# Patient Record
Sex: Female | Born: 1940 | Race: White | Hispanic: No | Marital: Married
Health system: Southern US, Community
[De-identification: ages and names within clinical notes are randomized; demographics above are authoritative.]

---

## 2000-08-16 ENCOUNTER — Encounter: Payer: Self-pay | Admitting: Neurosurgery

## 2000-08-16 ENCOUNTER — Ambulatory Visit (HOSPITAL_COMMUNITY): Admission: RE | Admit: 2000-08-16 | Discharge: 2000-08-16 | Payer: Self-pay | Admitting: Neurosurgery

## 2000-08-25 ENCOUNTER — Encounter: Admission: RE | Admit: 2000-08-25 | Discharge: 2000-11-23 | Payer: Self-pay | Admitting: Anesthesiology

## 2004-10-30 ENCOUNTER — Ambulatory Visit: Payer: Self-pay

## 2004-11-12 ENCOUNTER — Ambulatory Visit: Payer: Self-pay

## 2004-12-12 ENCOUNTER — Ambulatory Visit: Payer: Self-pay | Admitting: Gastroenterology

## 2006-01-23 ENCOUNTER — Other Ambulatory Visit: Payer: Self-pay

## 2006-01-24 ENCOUNTER — Inpatient Hospital Stay: Payer: Self-pay | Admitting: Internal Medicine

## 2006-09-16 ENCOUNTER — Other Ambulatory Visit: Payer: Self-pay

## 2006-09-16 ENCOUNTER — Inpatient Hospital Stay: Payer: Self-pay | Admitting: Internal Medicine

## 2007-11-02 ENCOUNTER — Ambulatory Visit: Payer: Self-pay

## 2008-11-13 ENCOUNTER — Ambulatory Visit: Payer: Self-pay

## 2009-11-14 ENCOUNTER — Ambulatory Visit: Payer: Self-pay

## 2010-12-04 ENCOUNTER — Ambulatory Visit: Payer: Self-pay

## 2010-12-30 ENCOUNTER — Ambulatory Visit: Payer: Self-pay

## 2011-03-07 ENCOUNTER — Inpatient Hospital Stay: Payer: Self-pay | Admitting: Internal Medicine

## 2011-05-18 ENCOUNTER — Ambulatory Visit: Payer: Self-pay | Admitting: Podiatry

## 2011-05-18 DIAGNOSIS — I499 Cardiac arrhythmia, unspecified: Secondary | ICD-10-CM

## 2011-05-22 ENCOUNTER — Ambulatory Visit: Payer: Self-pay | Admitting: Podiatry

## 2011-05-26 LAB — WOUND CULTURE

## 2011-05-26 LAB — PATHOLOGY REPORT

## 2011-06-09 ENCOUNTER — Ambulatory Visit: Payer: Self-pay

## 2011-06-25 ENCOUNTER — Ambulatory Visit: Payer: Self-pay | Admitting: Podiatry

## 2011-06-29 ENCOUNTER — Ambulatory Visit: Payer: Self-pay

## 2011-07-01 ENCOUNTER — Ambulatory Visit: Payer: Self-pay | Admitting: Podiatry

## 2011-07-01 LAB — CREATININE, SERUM
Creatinine: 0.57 mg/dL — ABNORMAL LOW (ref 0.60–1.30)
EGFR (African American): >60
EGFR (Non-African Amer.): >60

## 2011-07-10 ENCOUNTER — Ambulatory Visit: Payer: Self-pay | Admitting: Podiatry

## 2011-07-13 LAB — WOUND CULTURE

## 2011-07-14 LAB — WOUND CULTURE

## 2011-07-14 LAB — PATHOLOGY REPORT

## 2011-07-16 LAB — CULTURE, FUNGUS WITHOUT SMEAR

## 2011-08-04 ENCOUNTER — Other Ambulatory Visit: Payer: Self-pay | Admitting: Podiatry

## 2011-08-04 LAB — WOUND CULTURE

## 2011-08-06 ENCOUNTER — Ambulatory Visit: Payer: Self-pay

## 2011-08-08 LAB — WOUND CULTURE

## 2011-08-10 LAB — CULTURE, FUNGUS WITHOUT SMEAR

## 2011-08-13 LAB — CULTURE, FUNGUS WITHOUT SMEAR

## 2011-08-17 LAB — CULTURE, FUNGUS WITHOUT SMEAR

## 2011-09-24 ENCOUNTER — Emergency Department: Payer: Self-pay | Admitting: Emergency Medicine

## 2011-09-24 LAB — URINALYSIS, COMPLETE
Bilirubin,UR: NEGATIVE
Glucose,UR: >=500 mg/dL (ref 0–75)
Ph: 6 (ref 4.5–8.0)
Protein: 30
RBC,UR: 1 /HPF (ref 0–5)
Squamous Epithelial: 4
WBC UR: 17 /HPF (ref 0–5)

## 2011-09-24 LAB — COMPREHENSIVE METABOLIC PANEL
Albumin: 3.8 g/dL (ref 3.4–5.0)
Anion Gap: 8 (ref 7–16)
Bilirubin,Total: 0.4 mg/dL (ref 0.2–1.0)
Calcium, Total: 9.5 mg/dL (ref 8.5–10.1)
Chloride: 95 mmol/L — ABNORMAL LOW (ref 98–107)
EGFR (African American): >60
Glucose: 296 mg/dL — ABNORMAL HIGH (ref 65–99)
Osmolality: 279 (ref 275–301)
Potassium: 3.8 mmol/L (ref 3.5–5.1)
SGOT(AST): 19 U/L (ref 15–37)
SGPT (ALT): 23 U/L
Total Protein: 8.2 g/dL (ref 6.4–8.2)

## 2011-09-24 LAB — CBC
HCT: 41.8 % (ref 35.0–47.0)
MCH: 28.1 pg (ref 26.0–34.0)
MCV: 87 fL (ref 80–100)
Platelet: 234 10*3/uL (ref 150–440)
WBC: 11.4 10*3/uL — ABNORMAL HIGH (ref 3.6–11.0)

## 2011-10-07 ENCOUNTER — Emergency Department: Payer: Self-pay

## 2011-10-07 LAB — URINALYSIS, COMPLETE
Bilirubin,UR: NEGATIVE
Blood: NEGATIVE
Glucose,UR: 150 mg/dL (ref 0–75)
Hyaline Cast: 1
Leukocyte Esterase: NEGATIVE
Nitrite: NEGATIVE
Ph: 6 (ref 4.5–8.0)
Protein: 30
RBC,UR: 1 /HPF (ref 0–5)
Specific Gravity: 1.008 (ref 1.003–1.030)
Squamous Epithelial: 2
WBC UR: 15 /HPF (ref 0–5)

## 2011-10-07 LAB — COMPREHENSIVE METABOLIC PANEL
Albumin: 4.3 g/dL (ref 3.4–5.0)
Anion Gap: 13 (ref 7–16)
BUN: 12 mg/dL (ref 7–18)
Bilirubin,Total: 0.5 mg/dL (ref 0.2–1.0)
Calcium, Total: 9.6 mg/dL (ref 8.5–10.1)
Creatinine: 0.53 mg/dL — ABNORMAL LOW (ref 0.60–1.30)
EGFR (African American): >60
Glucose: 297 mg/dL — ABNORMAL HIGH (ref 65–99)
Osmolality: 275 (ref 275–301)
SGOT(AST): 21 U/L (ref 15–37)
SGPT (ALT): 19 U/L
Sodium: 132 mmol/L — ABNORMAL LOW (ref 136–145)

## 2011-10-07 LAB — CBC
HCT: 44.3 % (ref 35.0–47.0)
HGB: 15.2 g/dL (ref 12.0–16.0)
MCH: 29.1 pg (ref 26.0–34.0)
MCHC: 34.3 g/dL (ref 32.0–36.0)
MCV: 85 fL (ref 80–100)
RBC: 5.22 10*6/uL — ABNORMAL HIGH (ref 3.80–5.20)
WBC: 9.3 10*3/uL (ref 3.6–11.0)

## 2011-10-07 LAB — TSH: Thyroid Stimulating Horm: 0.04 u[IU]/mL — ABNORMAL LOW

## 2011-10-07 LAB — LIPASE, BLOOD: Lipase: 47 U/L — ABNORMAL LOW (ref 73–393)

## 2011-10-12 LAB — URINE CULTURE

## 2011-11-20 ENCOUNTER — Ambulatory Visit: Payer: Self-pay | Admitting: Internal Medicine

## 2011-11-20 LAB — CREATININE, SERUM
Creatinine: 1.1 mg/dL (ref 0.60–1.30)
EGFR (African American): 60 — ABNORMAL LOW
EGFR (Non-African Amer.): 52 — ABNORMAL LOW

## 2011-12-03 ENCOUNTER — Encounter: Payer: Self-pay | Admitting: Otolaryngology

## 2011-12-17 ENCOUNTER — Encounter: Payer: Self-pay | Admitting: Otolaryngology

## 2011-12-28 ENCOUNTER — Emergency Department: Payer: Self-pay | Admitting: Emergency Medicine

## 2011-12-28 LAB — URINALYSIS, COMPLETE
Bilirubin,UR: NEGATIVE
Blood: NEGATIVE
Glucose,UR: 50 mg/dL (ref 0–75)
Leukocyte Esterase: NEGATIVE
Nitrite: POSITIVE
Ph: 5 (ref 4.5–8.0)
Protein: NEGATIVE
RBC,UR: <1 /HPF (ref 0–5)
Specific Gravity: 1.044 (ref 1.003–1.030)
Squamous Epithelial: <1
WBC UR: 2 /HPF (ref 0–5)

## 2011-12-28 LAB — COMPREHENSIVE METABOLIC PANEL
Albumin: 4.1 g/dL (ref 3.4–5.0)
Alkaline Phosphatase: 96 U/L (ref 50–136)
Anion Gap: 12 (ref 7–16)
BUN: 16 mg/dL (ref 7–18)
Bilirubin,Total: 0.6 mg/dL (ref 0.2–1.0)
Calcium, Total: 9.6 mg/dL (ref 8.5–10.1)
Chloride: 99 mmol/L (ref 98–107)
Co2: 25 mmol/L (ref 21–32)
Creatinine: 0.95 mg/dL (ref 0.60–1.30)
EGFR (African American): >60
EGFR (Non-African Amer.): >60
Glucose: 192 mg/dL — ABNORMAL HIGH (ref 65–99)
Osmolality: 278 (ref 275–301)
Potassium: 3.3 mmol/L — ABNORMAL LOW (ref 3.5–5.1)
SGOT(AST): 15 U/L (ref 15–37)
SGPT (ALT): 16 U/L (ref 12–78)
Sodium: 136 mmol/L (ref 136–145)
Total Protein: 8.5 g/dL — ABNORMAL HIGH (ref 6.4–8.2)

## 2011-12-28 LAB — CBC
HCT: 39.6 % (ref 35.0–47.0)
HGB: 13.5 g/dL (ref 12.0–16.0)
MCH: 30 pg (ref 26.0–34.0)
MCHC: 34.2 g/dL (ref 32.0–36.0)
MCV: 88 fL (ref 80–100)
Platelet: 342 10*3/uL (ref 150–440)
RBC: 4.51 10*6/uL (ref 3.80–5.20)
RDW: 14.2 % (ref 11.5–14.5)
WBC: 8.3 10*3/uL (ref 3.6–11.0)

## 2011-12-28 LAB — TROPONIN I: Troponin-I: <0.02 ng/mL

## 2011-12-28 LAB — LIPASE, BLOOD: Lipase: 46 U/L — ABNORMAL LOW (ref 73–393)

## 2012-01-13 ENCOUNTER — Ambulatory Visit: Payer: Self-pay | Admitting: Internal Medicine

## 2012-01-17 ENCOUNTER — Encounter: Payer: Self-pay | Admitting: Otolaryngology

## 2012-01-26 ENCOUNTER — Ambulatory Visit: Payer: Self-pay | Admitting: Internal Medicine

## 2012-02-01 ENCOUNTER — Inpatient Hospital Stay: Payer: Self-pay | Admitting: Internal Medicine

## 2012-02-01 LAB — URINALYSIS, COMPLETE
Bilirubin,UR: NEGATIVE
Hyaline Cast: 6
Ph: 5 (ref 4.5–8.0)
Protein: 100
RBC,UR: 2 /HPF (ref 0–5)
Specific Gravity: 1.02 (ref 1.003–1.030)
Squamous Epithelial: NONE SEEN

## 2012-02-01 LAB — CBC WITH DIFFERENTIAL/PLATELET
Basophil %: 0.2 %
Eosinophil %: 0.1 %
Lymphocyte %: 5.2 %
MCH: 30.9 pg (ref 26.0–34.0)
MCV: 91 fL (ref 80–100)
Monocyte #: 1.6 x10 3/mm — ABNORMAL HIGH (ref 0.2–0.9)
Platelet: 306 10*3/uL (ref 150–440)
RBC: 4.12 10*6/uL (ref 3.80–5.20)
RDW: 13.4 % (ref 11.5–14.5)
WBC: 21.9 10*3/uL — ABNORMAL HIGH (ref 3.6–11.0)

## 2012-02-01 LAB — COMPREHENSIVE METABOLIC PANEL
Albumin: 3.2 g/dL — ABNORMAL LOW (ref 3.4–5.0)
Alkaline Phosphatase: 89 U/L (ref 50–136)
Anion Gap: 11 (ref 7–16)
Calcium, Total: 8.8 mg/dL (ref 8.5–10.1)
Chloride: 99 mmol/L (ref 98–107)
Co2: 26 mmol/L (ref 21–32)
EGFR (Non-African Amer.): 55 — ABNORMAL LOW
Glucose: 174 mg/dL — ABNORMAL HIGH (ref 65–99)
SGOT(AST): 9 U/L — ABNORMAL LOW (ref 15–37)
SGPT (ALT): 12 U/L (ref 12–78)
Sodium: 136 mmol/L (ref 136–145)

## 2012-02-02 LAB — CBC WITH DIFFERENTIAL/PLATELET
Basophil #: 0 10*3/uL (ref 0.0–0.1)
Basophil %: 0.1 %
Eosinophil %: 0 %
HCT: 33.3 % — ABNORMAL LOW (ref 35.0–47.0)
HGB: 11.4 g/dL — ABNORMAL LOW (ref 12.0–16.0)
Lymphocyte #: 0.8 10*3/uL — ABNORMAL LOW (ref 1.0–3.6)
MCH: 31 pg (ref 26.0–34.0)
MCV: 91 fL (ref 80–100)
Monocyte #: 0.9 x10 3/mm (ref 0.2–0.9)
Monocyte %: 6.1 %
Neutrophil %: 88.6 %
Platelet: 228 10*3/uL (ref 150–440)
RBC: 3.68 10*6/uL — ABNORMAL LOW (ref 3.80–5.20)
WBC: 15 10*3/uL — ABNORMAL HIGH (ref 3.6–11.0)

## 2012-02-02 LAB — COMPREHENSIVE METABOLIC PANEL
Albumin: 2.8 g/dL — ABNORMAL LOW (ref 3.4–5.0)
Anion Gap: 7 (ref 7–16)
BUN: 16 mg/dL (ref 7–18)
Bilirubin,Total: 0.8 mg/dL (ref 0.2–1.0)
Co2: 27 mmol/L (ref 21–32)
Creatinine: 0.5 mg/dL — ABNORMAL LOW (ref 0.60–1.30)
EGFR (Non-African Amer.): >60
Glucose: 271 mg/dL — ABNORMAL HIGH (ref 65–99)
Osmolality: 279 (ref 275–301)
Potassium: 3.4 mmol/L — ABNORMAL LOW (ref 3.5–5.1)
SGOT(AST): 3 U/L — ABNORMAL LOW (ref 15–37)
Sodium: 134 mmol/L — ABNORMAL LOW (ref 136–145)
Total Protein: 7 g/dL (ref 6.4–8.2)

## 2012-02-03 LAB — CBC WITH DIFFERENTIAL/PLATELET
Basophil %: 0.2 %
Eosinophil #: 0 10*3/uL (ref 0.0–0.7)
Eosinophil %: 0.1 %
HGB: 9.8 g/dL — ABNORMAL LOW (ref 12.0–16.0)
Lymphocyte #: 1.1 10*3/uL (ref 1.0–3.6)
Lymphocyte %: 8.3 %
MCH: 31.3 pg (ref 26.0–34.0)
MCHC: 34.1 g/dL (ref 32.0–36.0)
MCV: 92 fL (ref 80–100)
Monocyte #: 0.9 x10 3/mm (ref 0.2–0.9)
Monocyte %: 7 %
Neutrophil #: 11 10*3/uL — ABNORMAL HIGH (ref 1.4–6.5)
Neutrophil %: 84.4 %
Platelet: 202 10*3/uL (ref 150–440)

## 2012-02-03 LAB — BASIC METABOLIC PANEL
BUN: 11 mg/dL (ref 7–18)
Calcium, Total: 8.4 mg/dL — ABNORMAL LOW (ref 8.5–10.1)
Chloride: 103 mmol/L (ref 98–107)
Co2: 24 mmol/L (ref 21–32)
Osmolality: 280 (ref 275–301)
Potassium: 4.1 mmol/L (ref 3.5–5.1)

## 2012-02-04 LAB — CBC WITH DIFFERENTIAL/PLATELET
Basophil #: 0 10*3/uL (ref 0.0–0.1)
Eosinophil #: 0 10*3/uL (ref 0.0–0.7)
Eosinophil %: 0.3 %
HCT: 30.8 % — ABNORMAL LOW (ref 35.0–47.0)
Lymphocyte #: 1 10*3/uL (ref 1.0–3.6)
Lymphocyte %: 8.7 %
MCH: 30.6 pg (ref 26.0–34.0)
MCV: 92 fL (ref 80–100)
Monocyte %: 7.7 %
Platelet: 246 10*3/uL (ref 150–440)
RBC: 3.35 10*6/uL — ABNORMAL LOW (ref 3.80–5.20)
RDW: 13.1 % (ref 11.5–14.5)
WBC: 11.5 10*3/uL — ABNORMAL HIGH (ref 3.6–11.0)

## 2012-02-04 LAB — DIGOXIN LEVEL: Digoxin: 0.55 ng/mL

## 2012-02-05 LAB — BASIC METABOLIC PANEL
Anion Gap: 8 (ref 7–16)
BUN: 9 mg/dL (ref 7–18)
Calcium, Total: 8.8 mg/dL (ref 8.5–10.1)
Chloride: 97 mmol/L — ABNORMAL LOW (ref 98–107)
EGFR (African American): >60
Glucose: 100 mg/dL — ABNORMAL HIGH (ref 65–99)
Osmolality: 271 (ref 275–301)

## 2012-02-05 LAB — CBC WITH DIFFERENTIAL/PLATELET
Basophil #: 0 10*3/uL (ref 0.0–0.1)
Basophil %: 0.4 %
Eosinophil %: 1 %
HCT: 32.6 % — ABNORMAL LOW (ref 35.0–47.0)
HGB: 11.3 g/dL — ABNORMAL LOW (ref 12.0–16.0)
Lymphocyte %: 17.1 %
MCH: 31.5 pg (ref 26.0–34.0)
MCHC: 34.8 g/dL (ref 32.0–36.0)
Monocyte #: 0.8 x10 3/mm (ref 0.2–0.9)
Monocyte %: 10.4 %
Neutrophil %: 71.1 %
RBC: 3.6 10*6/uL — ABNORMAL LOW (ref 3.80–5.20)
WBC: 7.9 10*3/uL (ref 3.6–11.0)

## 2012-02-05 LAB — AMYLASE: Amylase: 11 U/L — ABNORMAL LOW (ref 25–115)

## 2012-02-06 LAB — COMPREHENSIVE METABOLIC PANEL
Albumin: 2.3 g/dL — ABNORMAL LOW (ref 3.4–5.0)
Alkaline Phosphatase: 83 U/L (ref 50–136)
Anion Gap: 6 — ABNORMAL LOW (ref 7–16)
BUN: 11 mg/dL (ref 7–18)
Calcium, Total: 9.1 mg/dL (ref 8.5–10.1)
Co2: 34 mmol/L — ABNORMAL HIGH (ref 21–32)
EGFR (African American): >60
EGFR (Non-African Amer.): >60
Potassium: 3.4 mmol/L — ABNORMAL LOW (ref 3.5–5.1)
SGPT (ALT): 11 U/L — ABNORMAL LOW (ref 12–78)
Sodium: 136 mmol/L (ref 136–145)
Total Protein: 6.7 g/dL (ref 6.4–8.2)

## 2012-02-06 LAB — CBC WITH DIFFERENTIAL/PLATELET
Basophil #: 0 10*3/uL (ref 0.0–0.1)
Basophil %: 0.6 %
Eosinophil #: 0.1 10*3/uL (ref 0.0–0.7)
HCT: 31.9 % — ABNORMAL LOW (ref 35.0–47.0)
HGB: 11.1 g/dL — ABNORMAL LOW (ref 12.0–16.0)
Lymphocyte #: 1.8 10*3/uL (ref 1.0–3.6)
Lymphocyte %: 30.3 %
MCHC: 34.7 g/dL (ref 32.0–36.0)
MCV: 90 fL (ref 80–100)
Monocyte %: 13.7 %
Neutrophil #: 3.2 10*3/uL (ref 1.4–6.5)
Platelet: 379 10*3/uL (ref 150–440)
RBC: 3.55 10*6/uL — ABNORMAL LOW (ref 3.80–5.20)
RDW: 13.1 % (ref 11.5–14.5)

## 2012-02-06 LAB — URINE CULTURE

## 2012-02-07 LAB — BASIC METABOLIC PANEL
BUN: 12 mg/dL (ref 7–18)
Calcium, Total: 9.3 mg/dL (ref 8.5–10.1)
Chloride: 98 mmol/L (ref 98–107)
Co2: 33 mmol/L — ABNORMAL HIGH (ref 21–32)
EGFR (Non-African Amer.): >60
Osmolality: 284 (ref 275–301)
Potassium: 4.2 mmol/L (ref 3.5–5.1)
Sodium: 139 mmol/L (ref 136–145)

## 2012-02-07 LAB — CBC WITH DIFFERENTIAL/PLATELET
Basophil #: 0 10*3/uL (ref 0.0–0.1)
Basophil %: 0.7 %
Eosinophil #: 0.1 10*3/uL (ref 0.0–0.7)
Eosinophil %: 1.9 %
HCT: 31.2 % — ABNORMAL LOW (ref 35.0–47.0)
HGB: 10.7 g/dL — ABNORMAL LOW (ref 12.0–16.0)
Lymphocyte %: 29.1 %
MCH: 30.9 pg (ref 26.0–34.0)
MCV: 90 fL (ref 80–100)
Monocyte #: 0.7 x10 3/mm (ref 0.2–0.9)
Monocyte %: 10.3 %
Neutrophil #: 3.8 10*3/uL (ref 1.4–6.5)
RBC: 3.45 10*6/uL — ABNORMAL LOW (ref 3.80–5.20)
RDW: 13.4 % (ref 11.5–14.5)

## 2012-02-07 LAB — CULTURE, BLOOD (SINGLE)

## 2012-02-08 LAB — CBC WITH DIFFERENTIAL/PLATELET
Basophil #: 0.1 10*3/uL (ref 0.0–0.1)
Basophil %: 1 %
Eosinophil #: 0.1 10*3/uL (ref 0.0–0.7)
HCT: 32.5 % — ABNORMAL LOW (ref 35.0–47.0)
HGB: 11.4 g/dL — ABNORMAL LOW (ref 12.0–16.0)
Lymphocyte %: 31.7 %
MCHC: 35.1 g/dL (ref 32.0–36.0)
Monocyte %: 10.6 %
Neutrophil %: 54.9 %
RBC: 3.59 10*6/uL — ABNORMAL LOW (ref 3.80–5.20)
RDW: 13.2 % (ref 11.5–14.5)
WBC: 7.3 10*3/uL (ref 3.6–11.0)

## 2012-02-08 LAB — BASIC METABOLIC PANEL
Calcium, Total: 9 mg/dL (ref 8.5–10.1)
Chloride: 99 mmol/L (ref 98–107)
Co2: 35 mmol/L — ABNORMAL HIGH (ref 21–32)
Osmolality: 277 (ref 275–301)
Potassium: 4.5 mmol/L (ref 3.5–5.1)

## 2012-02-09 LAB — URINALYSIS, COMPLETE
Bilirubin,UR: NEGATIVE
Glucose,UR: NEGATIVE mg/dL (ref 0–75)
Ph: 7 (ref 4.5–8.0)
Protein: NEGATIVE
RBC,UR: 8 /HPF (ref 0–5)
Renal Epithelial: 9
Specific Gravity: 1.013 (ref 1.003–1.030)
WBC UR: 6 /HPF (ref 0–5)

## 2012-02-09 LAB — CBC WITH DIFFERENTIAL/PLATELET
Basophil #: 0.1 10*3/uL (ref 0.0–0.1)
Eosinophil #: 0.1 10*3/uL (ref 0.0–0.7)
HCT: 30 % — ABNORMAL LOW (ref 35.0–47.0)
Lymphocyte #: 3.1 10*3/uL (ref 1.0–3.6)
MCH: 31.6 pg (ref 26.0–34.0)
MCHC: 34.8 g/dL (ref 32.0–36.0)
MCV: 91 fL (ref 80–100)
Monocyte #: 0.8 x10 3/mm (ref 0.2–0.9)
Monocyte %: 9.3 %
Neutrophil #: 4.6 10*3/uL (ref 1.4–6.5)
Neutrophil %: 53.1 %
RDW: 13.4 % (ref 11.5–14.5)

## 2012-02-09 LAB — BASIC METABOLIC PANEL
BUN: 10 mg/dL (ref 7–18)
Creatinine: 0.62 mg/dL (ref 0.60–1.30)
EGFR (Non-African Amer.): >60
Glucose: 196 mg/dL — ABNORMAL HIGH (ref 65–99)
Osmolality: 276 (ref 275–301)
Potassium: 4.1 mmol/L (ref 3.5–5.1)
Sodium: 136 mmol/L (ref 136–145)

## 2012-02-16 ENCOUNTER — Encounter: Payer: Self-pay | Admitting: Otolaryngology

## 2012-02-22 ENCOUNTER — Ambulatory Visit: Payer: Self-pay | Admitting: Internal Medicine

## 2012-03-07 ENCOUNTER — Ambulatory Visit: Payer: Self-pay | Admitting: Internal Medicine

## 2012-03-29 ENCOUNTER — Ambulatory Visit: Payer: Self-pay | Admitting: Internal Medicine

## 2012-06-13 ENCOUNTER — Other Ambulatory Visit: Payer: Self-pay | Admitting: Internal Medicine

## 2012-06-13 LAB — RAPID INFLUENZA A&B ANTIGENS

## 2012-10-28 ENCOUNTER — Ambulatory Visit: Payer: Self-pay | Admitting: Orthopedic Surgery

## 2012-12-14 ENCOUNTER — Ambulatory Visit: Payer: Self-pay | Admitting: Orthopedic Surgery

## 2013-08-24 IMAGING — CT CT ABD-PELV W/O CM
1 of 2 series · 15 of 32 positions shown, 19 images · non-contrast
Comparison: none

REASON FOR EXAM: (1) LUQ more swollen and tender; (2) increase temp
COMMENTS:

[Series 2: 3mm soft tissue · axial · 0.76mm/px · z∈[-676,-232]mm · 15 of 162 slices shown, 19 images]
[im 7/162  soft-tissue]
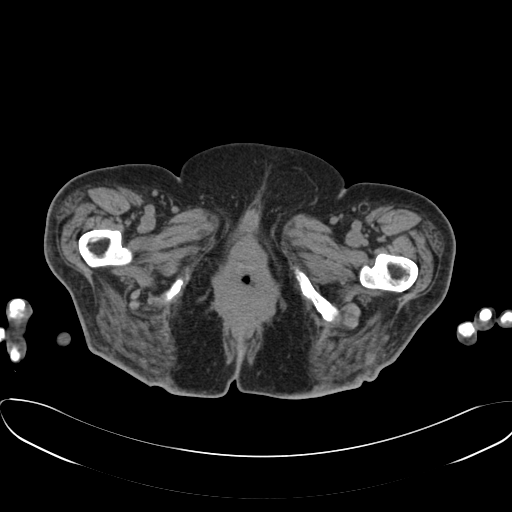
[im 7/162  bone]
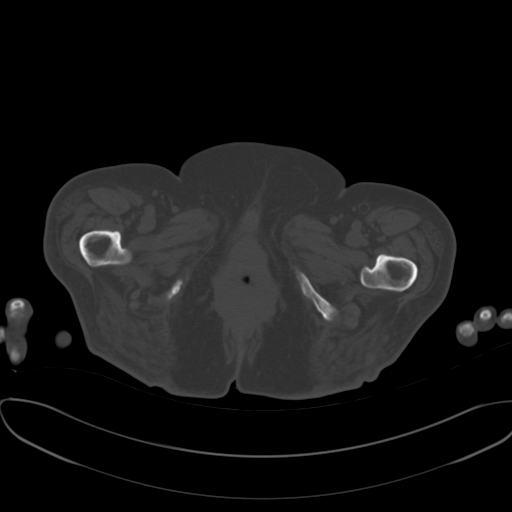
[im 20/162  soft-tissue]
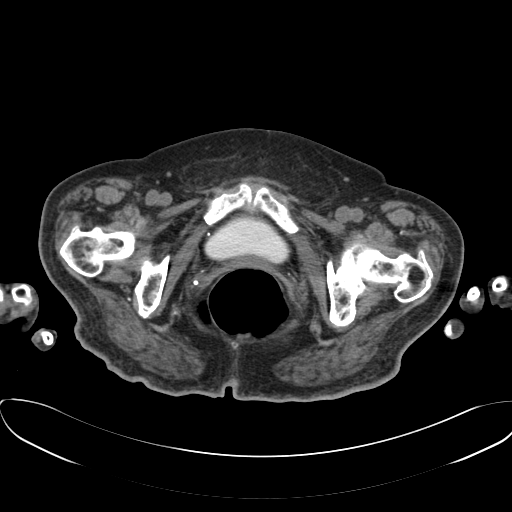
[im 33/162  soft-tissue]
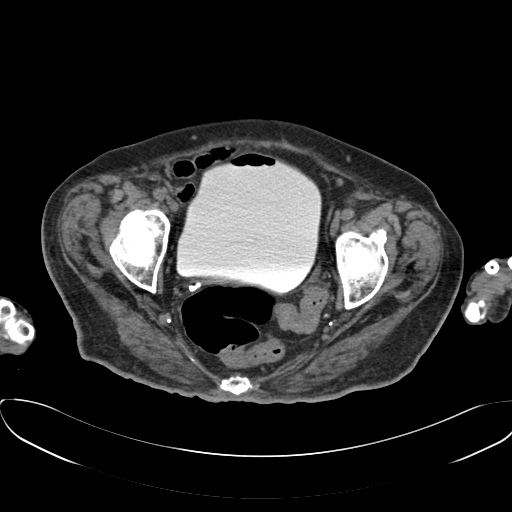
[im 46/162  soft-tissue]
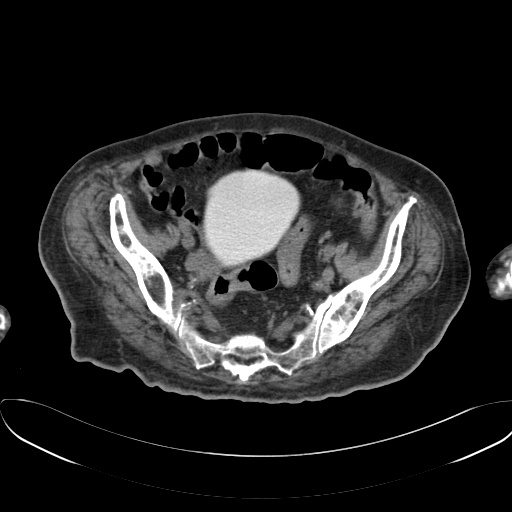
[im 58/162  soft-tissue]
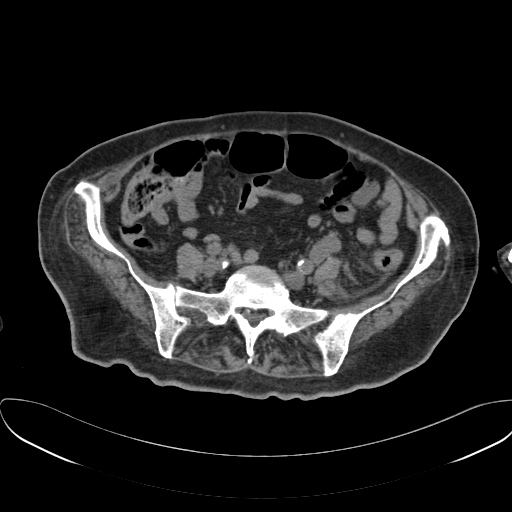
[im 71/162  soft-tissue]
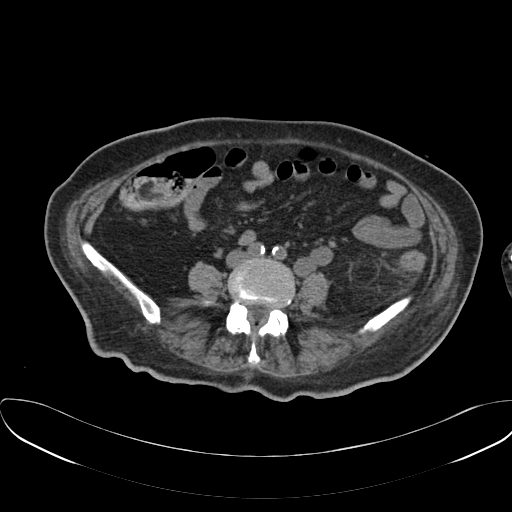
[im 84/162  soft-tissue]
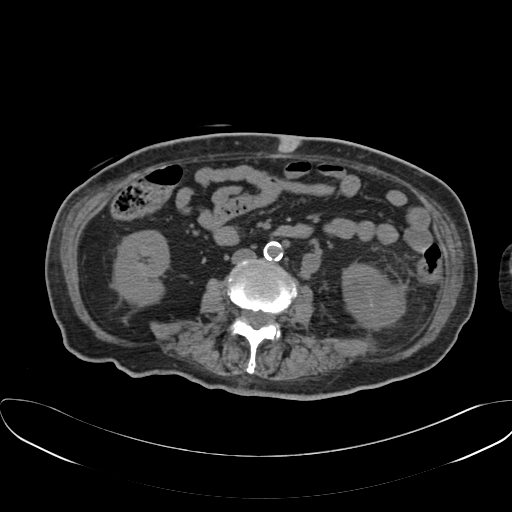
[im 91/162  soft-tissue]
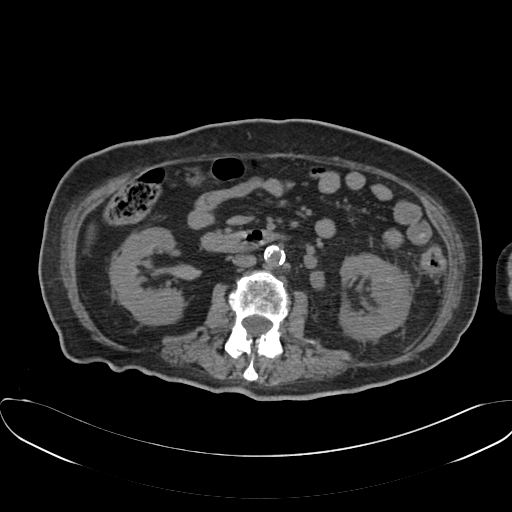
[im 104/162  soft-tissue]
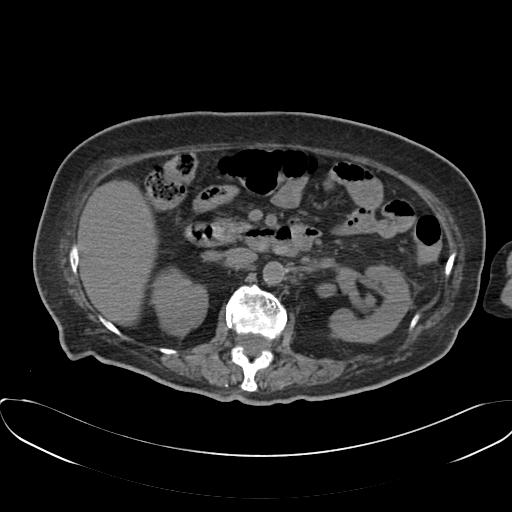
[im 104/162  bone]
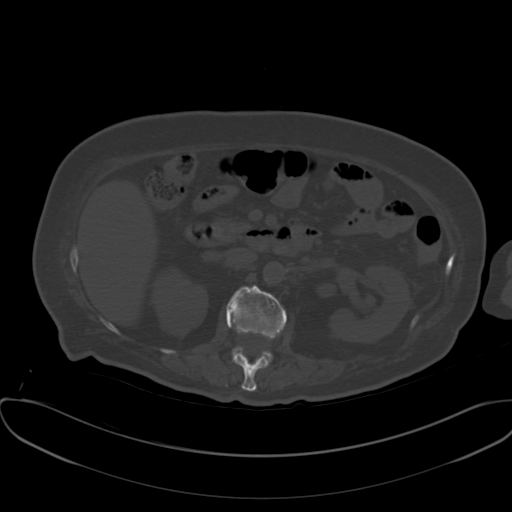
[im 116/162  soft-tissue]
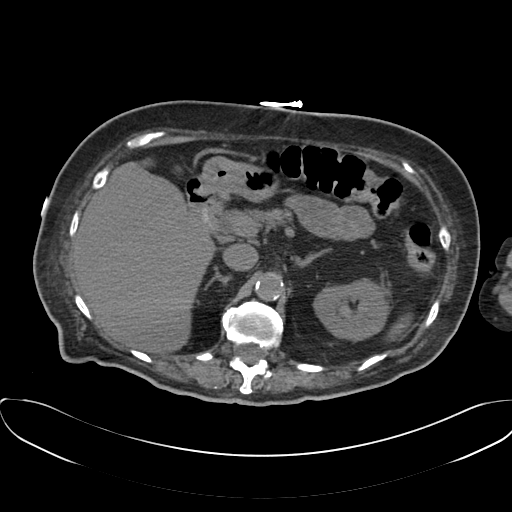
[im 129/162  soft-tissue]
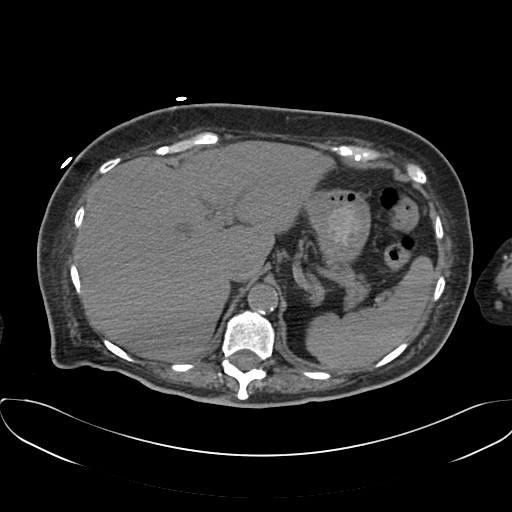
[im 136/162  lung]
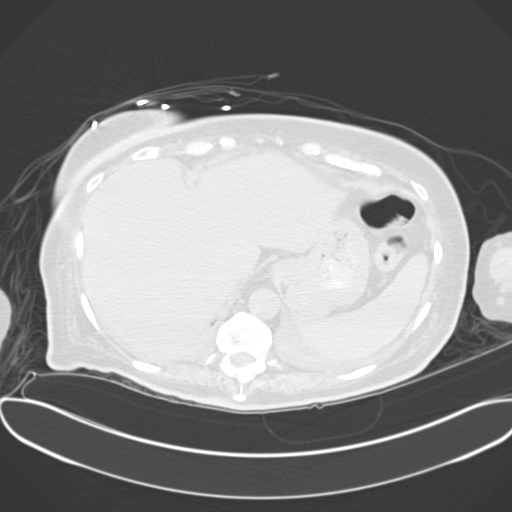
[im 142/162  soft-tissue]
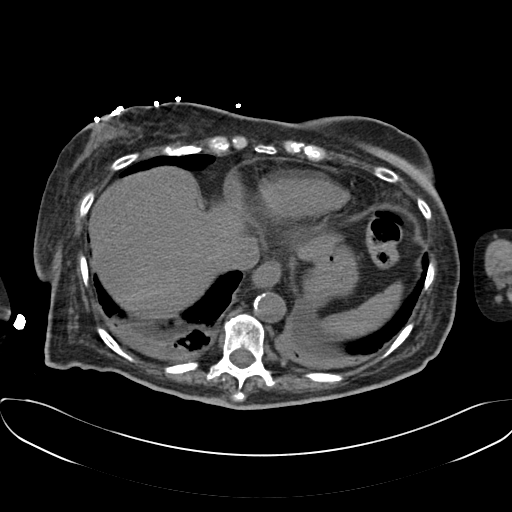
[im 142/162  lung]
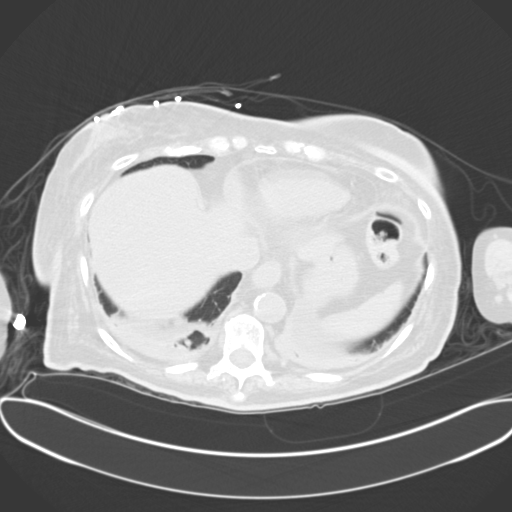
[im 149/162  lung]
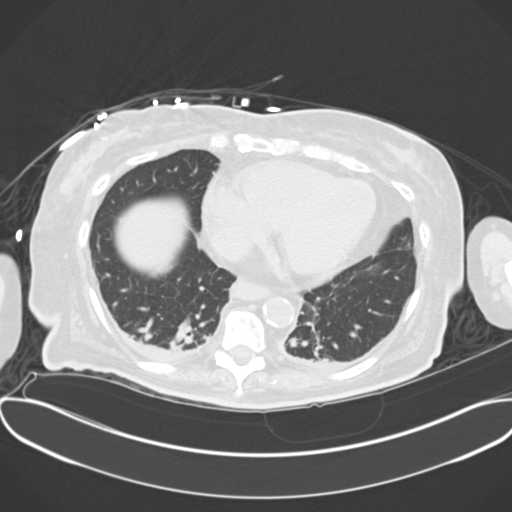
[im 155/162  soft-tissue]
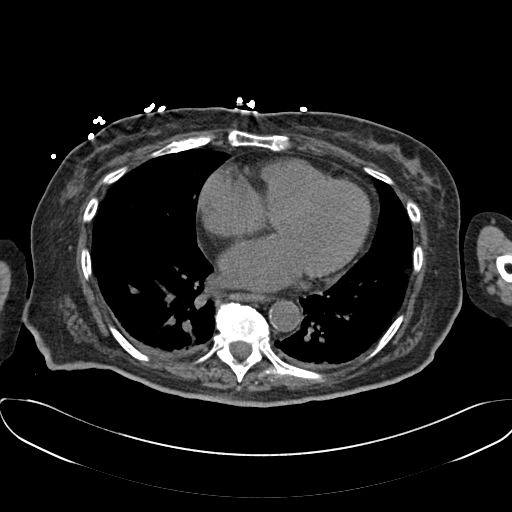
[im 155/162  lung]
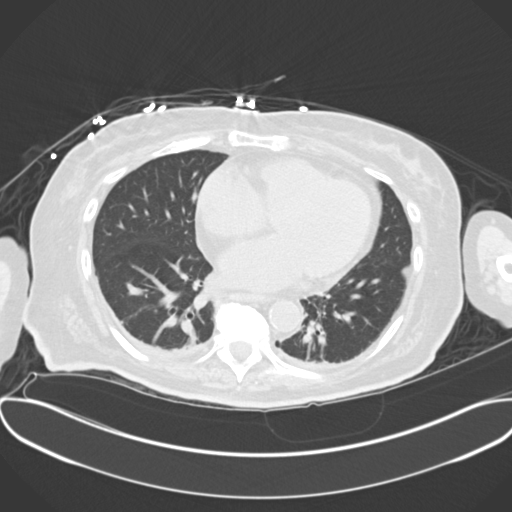

[15 of 32 positions shown; findings below may reference images not displayed]

PROCEDURE:     CT  - CT ABDOMEN AND PELVIS W[DATE]  [DATE]

RESULT:     CT of the abdomen and pelvis without additional contrast is
compared to the earlier study from [DATE] p.m. on 01 February, 2012 which was
performed with contrast. That study showed an abnormal area in the left
kidney with perinephric stranding. The area of abnormality in the left
kidney is less well demonstrated on today's exam given the lack of acute
contrast. There is perinephric stranding in the mid and lower left kidney
region with some thickening of the perirenal fascia. The appearance is
consistent with pyelonephritis. There is no hydronephrosis evident. The left
ureter is somewhat prominent but unchanged. There is urinary duplication on
the left with prominence of the upper pole and lower pole moieties. The
possibility mentioned previously of a rupture of the left renal cysts is not
completely excluded. The descending colon adjacent to the area of stranding
is slightly thickened which could be secondary. No diverticula are seen in
the region. There does not appear to be significant colonic diverticular
disease. The urinary bladder is filled with contrast opacified urine area
the abdominal wall appears intact. Scattered atherosclerotic calcification
is present. The right kidney is unremarkable. The other abdominal viscera
appear to be within normal limits area there is bilateral atelectasis in the
dependent portions of the lower lobes with trace bilateral pleural effusions.
IMPRESSION: No significant interval change compared to the previous
study. Findings may represent the sequela of pyelonephritis. Possibly some
reactive thickening in the ascending colon adjacent to the area of present
inflammation in the perinephric region on the left. Duplication of the
collecting system and ureters on the left with mild prominence. This is
unchanged. Trace pleural effusions and some dependent atelectasis.

[REDACTED]

## 2013-08-27 IMAGING — RF DG BARIUM SWALLOW
2 series · 8 of 8 positions shown · non-contrast
Comparison: none

REASON FOR EXAM: recurrent regurgitation
COMMENTS:

PROCEDURE:     FL  - FL BARIUM SWALLOW  - February 05, 2012  [DATE]
RESULT:     Comparison: None.
TECHNIQUE: Standard double contrast swallow was performed with monitoring by
intermittent fluoroscopy.

[Series 1: fluoro_barium 2fps_bw · 0.17mm/px · 4 of 23 frames shown (1 of 2)]
[frame 4/23]
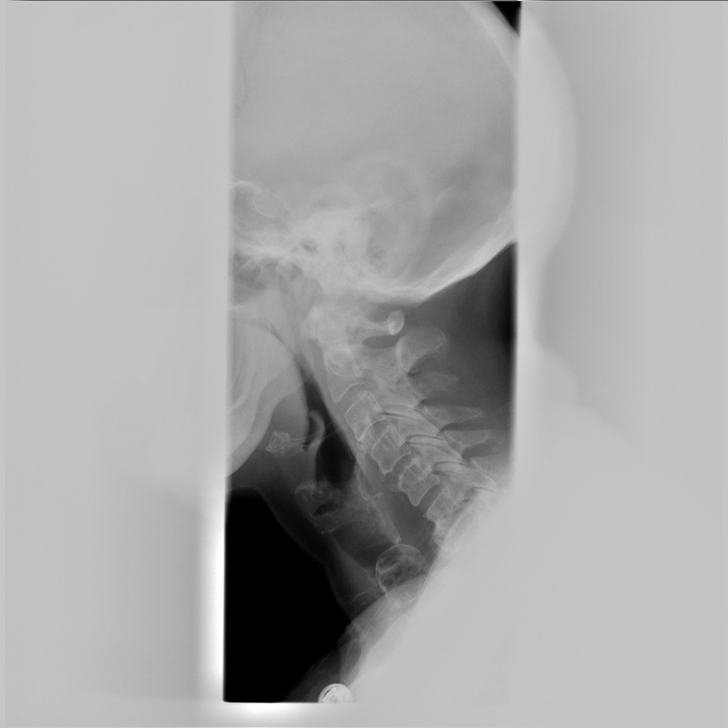
[frame 8/23]
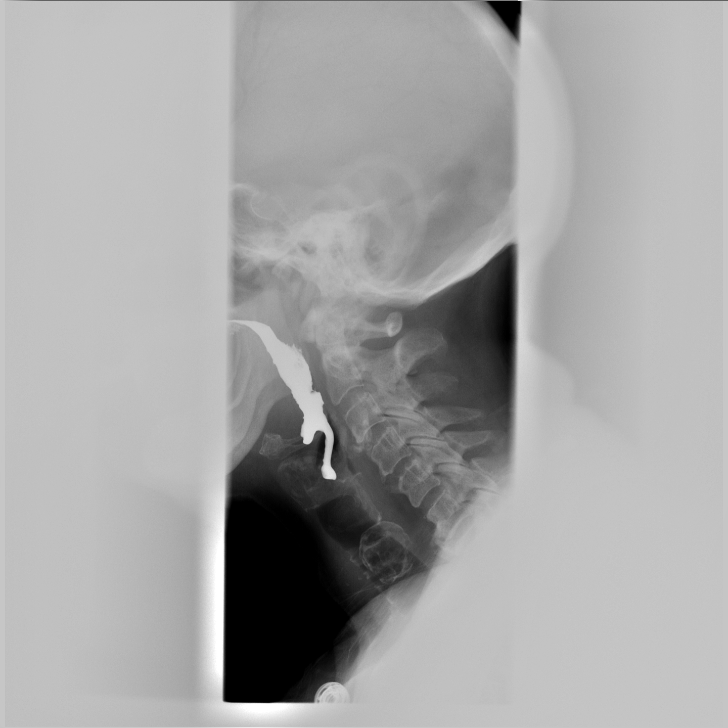
[frame 12/23]
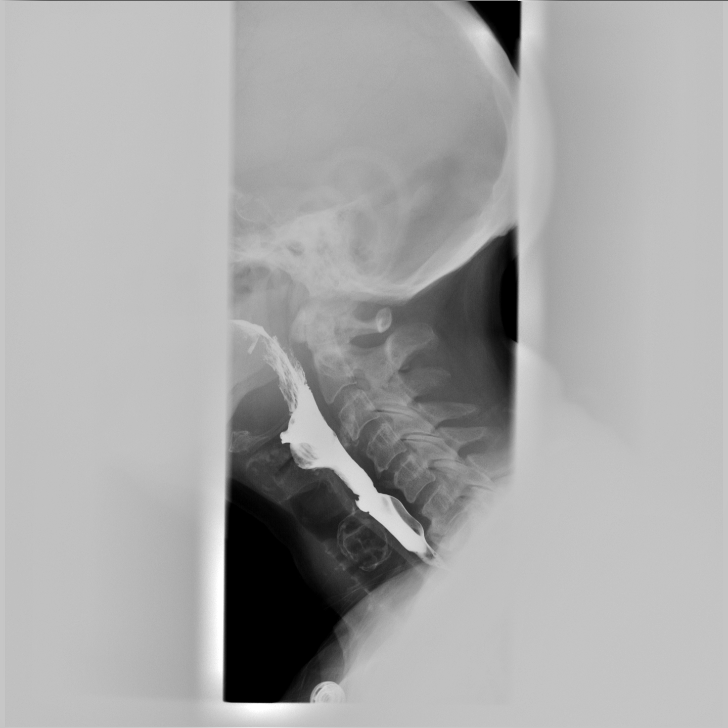
[frame 20/23]
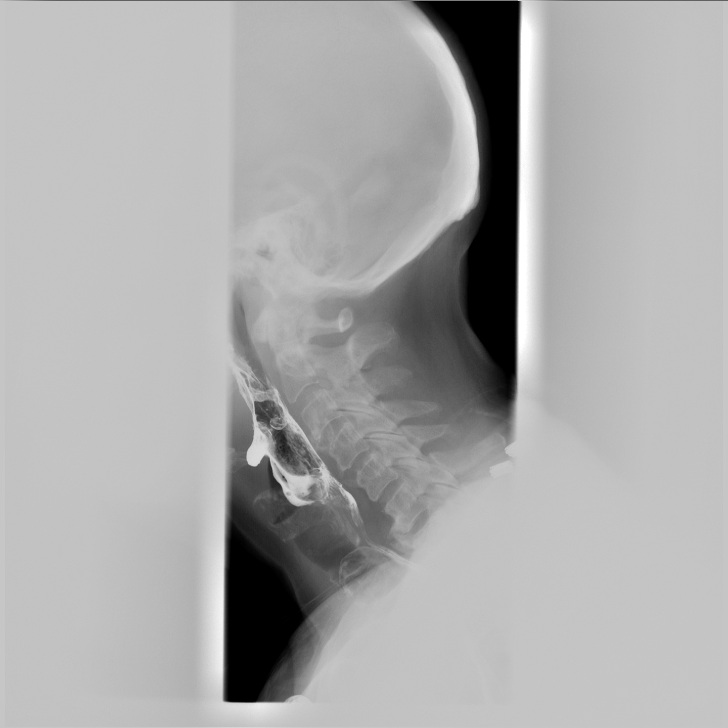

[Series 2: fluoro_barium 2fps_bw · 0.17mm/px · 4 of 16 frames shown (2 of 2)]
[frame 3/16]
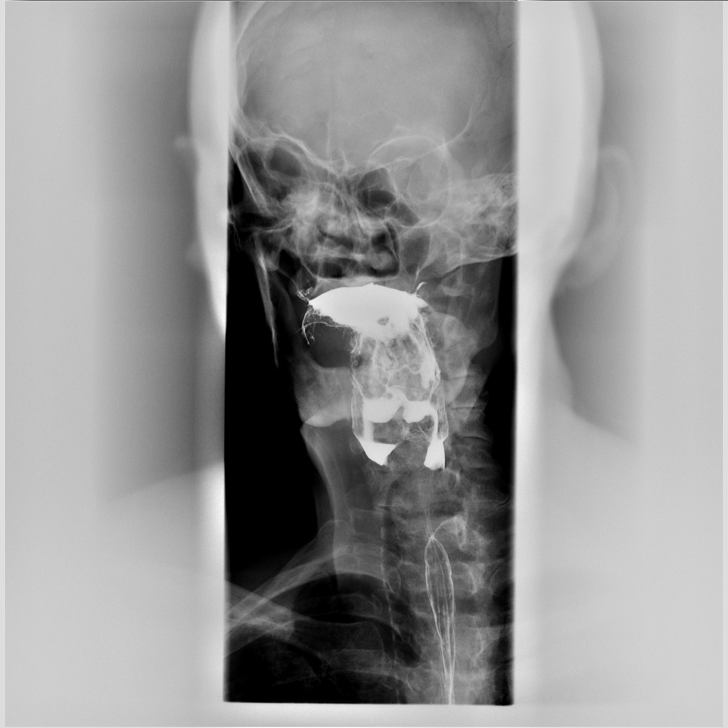
[frame 4/16]
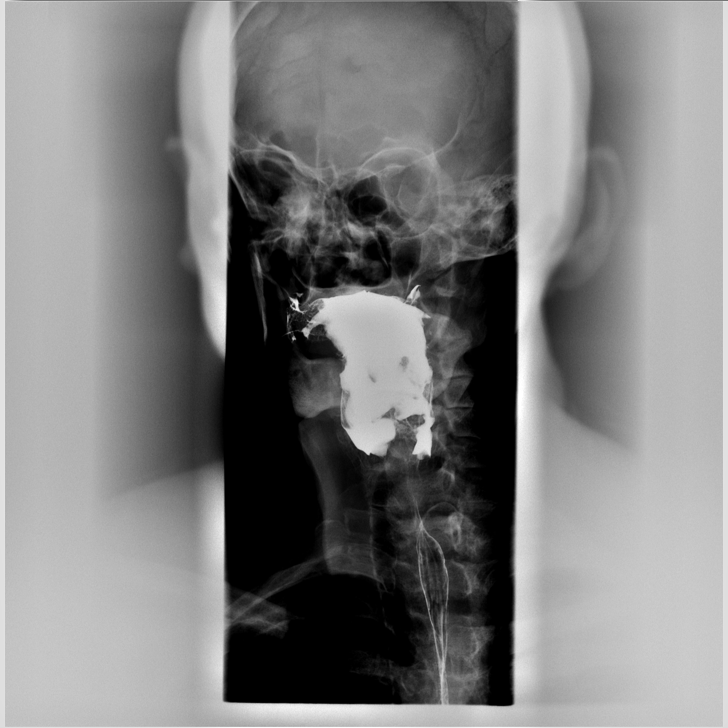
[frame 9/16]
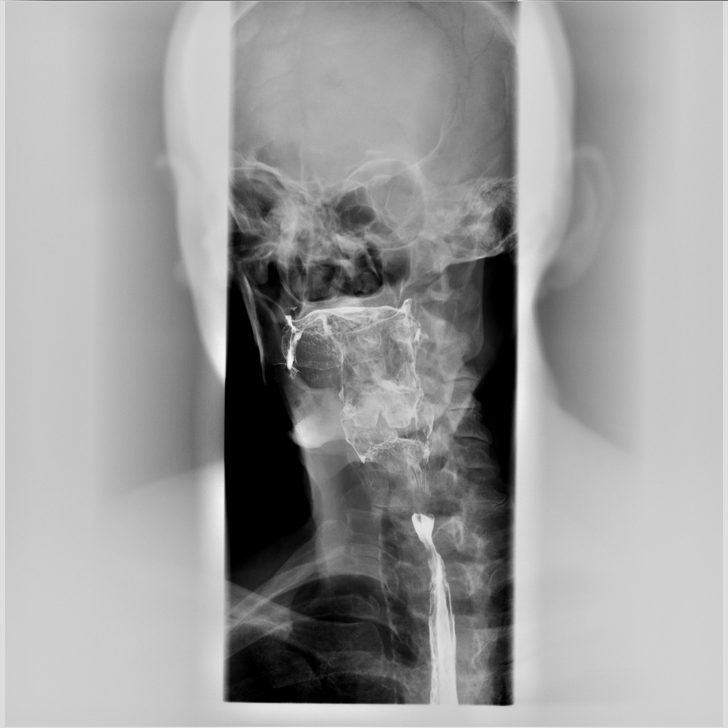
[frame 14/16]
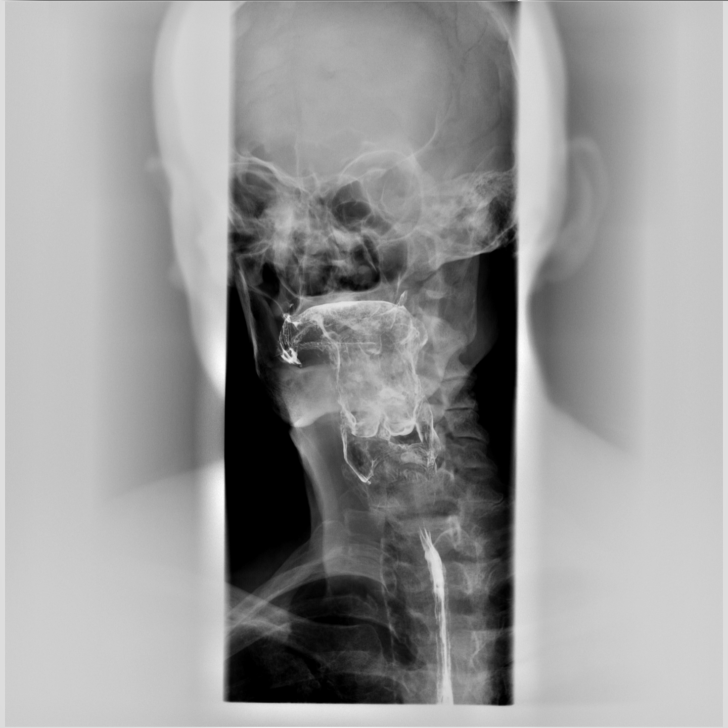

[8 of 8 positions shown; findings below may reference images not displayed]

FINDINGS: There is normal passage of barium contrast material through the hypopharynx
and into the cervical esophagus. No evidence of penetration or aspiration.
No intraluminal filling defect or ulceration identified in the thoracic
esophagus. There is mild smooth narrowing of the distal thoracic esophagus.
This prevented passage of a 13 mm barium tablet. There was a break in
primary peristaltic wave with several secondary contractions and a few
tertiary contractions. No gastroesophageal reflux was seen during
examination.
IMPRESSION: 1. Mild, smooth narrowing of the distal thoracic esophagus is nonspecific
and may be related to esophagitis. This prevented passage of a 13 mm barium
tablet. Further evaluation could be provided with endoscopy.
2. Mild esophageal dysmotility.

## 2013-10-16 ENCOUNTER — Ambulatory Visit
Admit: 2013-10-16 | Disposition: A | Discharge status: Home / Self Care | Payer: Self-pay | Attending: Nurse Practitioner | Admitting: Nurse Practitioner

## 2013-11-15 ENCOUNTER — Ambulatory Visit
Admit: 2013-11-15 | Disposition: A | Discharge status: Home / Self Care | Payer: Self-pay | Attending: Nurse Practitioner | Admitting: Nurse Practitioner

## 2014-02-15 ENCOUNTER — Ambulatory Visit
Admit: 2014-02-15 | Disposition: A | Discharge status: Home / Self Care | Payer: Self-pay | Attending: Nurse Practitioner | Admitting: Nurse Practitioner

## 2014-04-24 ENCOUNTER — Ambulatory Visit: Payer: Self-pay | Admitting: Internal Medicine

## 2014-07-12 ENCOUNTER — Emergency Department: Payer: Self-pay | Admitting: Emergency Medicine

## 2014-08-17 ENCOUNTER — Ambulatory Visit: Payer: Self-pay | Admitting: Nurse Practitioner

## 2014-09-04 NOTE — Consult Note (Signed)
PATIENT NAME:  Angela Ryan, Angela Ryan MR#:  [id number] DATE OF BIRTH:  08/03/1940  DATE OF CONSULTATION:  02/05/2012  REFERRING PHYSICIAN:   CONSULTING PHYSICIAN:  Rodman Keyawn S. Harrison, NP  ADDENDUM   IMPRESSION: As stands on previous consultation, and additionally, in review of prior imaging studies, was found that the patient has had a gastric emptying study which did reveal evidence of ejection fraction being low at 19%. Thus, prior history of gastroparesis was probably secondary to a known history of diabetes mellitus.   PLAN: The patient's presentation was reviewed with Dr. Bluford Kaufmannh, reviewed prior imaging studies. Gastric emptying study being done in 2008 revealed evidence of delayed gastric emptying at 19%. Possible differential diagnosis for nausea, vomiting, and associated left-sided abdominal pain are infectious, inflammatory, as well as related known history of gastroparesis. At this point, recommend monitoring of symptoms. She is able to tolerate solid food today. Continue with PPI therapy 40 mg daily. Consider low dose of Reglan given her known history of gastroparesis. We will monitor the patient over this weekend. If she continues to tolerate solid foods as well as the nausea and vomiting continue to improve, we will recommend an endoscopy evaluation inclusive of an esophagogastroduodenoscopy as well as a diagnostic colonoscopy on an outpatient basis. If her condition does not continue to improve and worsens in association with nausea and vomiting, will consider proceeding with esophagogastroduodenoscopy on Monday. Dr. Bluford Kaufmannh will follow the patient over the weekend. Esophagogastroduodenoscopy report was located, was performed in 2006 by Dr. Sharyne Peachrew Siegel with findings of normal esophagus, gastritis and normal examined duodenum.   These services provided by Rodman Keyawn S. Harrison, NP in collaboration with Lutricia FeilPaul Oh, MD.   ____________________________ Rodman Keyawn S. Harrison, NP dsh:ap D: 02/05/2012 13:05:09  ET T: 02/05/2012 13:44:11 ET JOB#: [id number] cc: Rodman Keyawn S. Harrison, NP, <Dictator> Rodman KeyAWN S HARRISON MD ELECTRONICALLY SIGNED 02/17/2012 15:13

## 2014-09-04 NOTE — Consult Note (Signed)
PATIENT NAME:  Angela Ryan, Angela Ryan MR#:  [id number] DATE OF BIRTH:  06/10/1940  DATE OF CONSULTATION:  02/05/2012  REFERRING PHYSICIAN:  Dr. Daniel Nones  CONSULTING PHYSICIAN:  Rodman Key, NP / Dr. Lutricia Feil  PRIMARY CARE PHYSICIAN: Dr. Daniel Nones   REASON FOR CONSULTATION: Refractory nausea.   HISTORY OF PRESENT ILLNESS: Angela Ryan is a 74 year old Caucasian female who has a significant past medical history of diabetes, depression, atrial fibrillation, hypertension, chronic foot pain status post recent surgeries in the past year for cellulitis, left foot. She was admitted for the chief complaint of pain all over and weakness. She does have a significant history of recurrent urinary tract infections. History is obtained from patient as well as her husband who is present as well as reviewing history and physical. She has onset of intractable vomiting on this past Sunday. Three to four days before that she was experiencing some nausea. It has been three or four days since the last episode of vomiting. She was found to be dehydrated. No hematemesis. She was experiencing some bilateral lower quadrant abdominal pain, midabdomen, more left side. Pain has subsided some since being admitted but still remains intermittent. Upon presenting to the room she is eating grapes as well as graham crackers and drinking Pepsi. Husband states this is really the first meal she has been able to kind of tolerate since she has been in the hospital. Weight loss over the past couple of years has been significant for 40 pounds. She did have a temperature of 99.5 upon admission and husband states it did spike to 102. She did not experience any dysuria, hematuria or frequency. Bowel pattern had been pretty much normal with her pattern being every day, every other day. Did experience several loose stools on Sunday in association with the vomiting. Last bowel movement was a couple of days ago. No rectal bleeding or no melena.   Known  history of two colonoscopies in the past but the most recent has been several years ago. Also history of an EGD numerous years ago. She denies any colonic polyps being noted or history of gastric ulcer disease.    During her hospitalization she had ultrasound of kidneys done on 02/02/2012 which showed a focal masslike region left kidney similar to a CT scan which was done 12 hours prior to that. Advised it was nonspecific given the close intimal time course. Differential would be probable renal mass as well as a focal infection given the appearance of the CT scan. CT scan of abdomen and pelvis without contrast done on 09/17 revealed abnormal area involving the left kidney with perinephritic stranding, area of abnormality in the left is less well demonstrated on today's exam given the lack of acute contrast. The perinephritic stranding in the mid to lower kidney region somewhat thickened the perirenal fascia. The appearance is consistent with pyelonephritis. No hydronephrosis. The left ureter is somewhat prominent but unchanged. 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 Possibly mention previous ruptured left renal cyst could not be excluded. The descending colon adjacent to the area of the stranding is slightly thickened possibly secondary. No diverticula is noted. Does not appear to be significant colonic diverticular disease. Urinary bladder is filled with contrast opacified urine area abdominal wall appears intact. Prior CT scan was done on 09/16 with contrast findings reflecting a sequela of partial rupture of left renal cyst. Etiology such as neoplasm, hematoma, possibly even infection  could not be excluded.   PAST MEDICAL HISTORY:  1. Diabetes mellitus. 2. Depression. 3. Atrial fibrillation. 4. Hypertension. 5. Chronic bilateral foot pain. 6. Chronic left lower chest pain for the past several months.   PAST SURGICAL HISTORY:  1. Three foot  surgeries in the past year. Left foot for cellulitis performed by Dr. Ether GriffinsFowler. 2. Appendectomy.  3. Hysterectomy, bilateral salpingo-oophorectomy at the age of 74 for dysplasia.  4. Cholecystectomy at the age of 74, gallstones. 5. Left breast biopsy at the age of 74 as well as 7233, both benign. 6. Right ear surgery, unable to elaborate details. 7. Cataract surgery bilaterally.  8. Laser eye surgery.   FAMILY HISTORY: Father deceased at the age of 72 from a stroke. Mother deceased at the age of 72 diabetes, myocardial infarction.   SOCIAL HISTORY: Resides with her husband. Ambulates with a cane at home independently. No tobacco. No alcohol use.   HOME MEDICATIONS:  1. Amitriptyline 15 mg orally once a day. 2. Benazepril 20 mg once a day. 3. Digoxin 250 mcg 1/2 tablet once a day. 4. Gabapentin 300 mg 4 times daily. 5. Glipizide 5 mg once a day. 6. Ibuprofen 200 mg every four hours as needed.  7. Lantus 10 units sub-Q at bedtime.  8. NovoLog 10 units sub-Q 2 times a day after meals.  9. Protonix 40 mg once a day. 10. Promethazine 12.5 mg every six hours as needed for nausea.  11. Tramadol 50 mg every four hours as needed.  12. Vicodin 5/500 mg 1 tablet orally every four hours.  13. Vitamin B12 1000 mcg orally once a day. 14. Vitamin D3 1000 international units once a day.   REVIEW OF SYSTEMS: CONSTITUTIONAL: Significant for weight loss. Significant for fever on admission, generalized weakness, fatigue. HEENT: Significant for hearing loss. No blurred vision, double vision. No epistaxis. PULMONARY: Denies cough, hemoptysis, wheezing. CARDIOVASCULAR: No chest pain, heart palpitations, syncopal or near syncopal episodes. GASTROINTESTINAL: See history of present illness. GENITOURINARY: Denies any dysuria, hematuria or frequency. HEME/LYMPH: Denies significant easy bruising and bleeding. MUSCULOSKELETAL: Significant for chronic pain involving left foot, ambulates with a cane at home. SKIN: No  lesions. No rashes. No evidence of jaundice or cyanosis.  NEUROLOGICAL: No history of transient ischemic attack, stroke or seizure disorder. PSYCH: Documented history of depression though patient denies. No history of anxiety.   PHYSICAL EXAMINATION:  VITAL SIGNS: Temperature 98.2, pulse 80, respirations 18, blood pressure 154/65, pulse oximetry 97%.   GENERAL: Well developed, slender 74 year old Caucasian female, no acute distress noted, eating grapes and graham crackers as stated. Husband at bedside.   HEENT: Normocephalic, atraumatic. Pupils equal, reactive to light. Conjunctivae clear. Sclerae anicteric.   NECK: Supple. Trachea midline. No lymphadenopathy, thyromegaly.   PULMONARY: Symmetric rise and fall of chest. Clear to auscultation throughout.   CARDIOVASCULAR: Regular rhythm, S1, S2. No murmurs, no gallops.   ABDOMEN: Slightly distended. Bowel sounds in four quadrants. No bruits. No masses. No evidence of hepatosplenomegaly. Marked discomfort noted to left side of abdomen, more left lower quadrant.   RECTAL: Deferred.  MUSCULOSKELETAL: Movement of all four extremities. No contractures. No clubbing.   EXTREMITIES: No edema.   PSYCH: Alert and oriented x4. Memory grossly intact.    NEUROLOGICAL: No gross neurological deficits.   LABORATORY, DIAGNOSTIC AND RADIOLOGICAL DATA: All laboratory study results as well as imaging studies reviewed by myself thus far of her admission. CT scans as noted above.    IMPRESSION: Patient admitted for generalized weakness  as well as noted to have fever, diagnosed initially with urinary tract infection but CT scan of abdomen and pelvis reveals more of a correlation or pyelonephritis. Abnormal GI findings on CT scan. Evidence of descending colon adjacent to the area noted renally of stranding slightly thickened in this area felt to be possibly secondary to renal findings. Nausea and vomiting prior to admission which has improved since being  admitted.   PLAN: Patient's presentation will be discussed with Dr. Lutricia Feil. Care plan to follow in addendum form.   ____________________________ Rodman Key, NP dsh:cms D: 02/05/2012 10:57:32 ET T: 02/05/2012 12:58:14 ET JOB#: [id number]  cc: Rodman Key, NP, <Dictator> Rodman Key MD ELECTRONICALLY SIGNED 02/17/2012 15:13

## 2014-09-04 NOTE — Consult Note (Signed)
EGD showed severe diffuse candidal esophagitis. Diflucan daily ordered. [facility] but will check back on Wed if patient still here. THanks.  Electronic Signatures: Lutricia Feilh, Giuliana Handyside (MD)  (Signed on 23-Sep-13 13:51)  Authored  Last Updated: 23-Sep-13 13:51 by Lutricia Feilh, Jamirra Curnow (MD)

## 2014-09-04 NOTE — Consult Note (Signed)
Chief Complaint:   Subjective/Chief Complaint Overall feeling much better. Less nausea. To be discharged later this afternoon.   VITAL SIGNS/ANCILLARY NOTES: **Vital Signs.:   25-Sep-13 08:07   Vital Signs Type Pre Medication   Temperature Temperature (F) 98.7   Celsius 37   Temperature Source Oral   Pulse Pulse 74   Respirations Respirations 18   Systolic BP Systolic BP 973   Diastolic BP (mmHg) Diastolic BP (mmHg) 69   Mean BP 94   Pulse Ox % Pulse Ox % 94   Pulse Ox Activity Level  At rest   Oxygen Delivery Room Air/ 21 %   Brief Assessment:   Cardiac Regular    Respiratory clear BS    Gastrointestinal Normal   Lab Results: Routine Chem:  24-Sep-13 04:54    Glucose, Serum  196   BUN 10   Creatinine (comp) 0.62   Sodium, Serum 136   Potassium, Serum 4.1   Chloride, Serum 98   CO2, Serum 31   Calcium (Total), Serum 9.3   Anion Gap 7   Osmolality (calc) 276   eGFR (African American) >60   eGFR (Non-African American) >60 (eGFR values <64m/min/1.73 m2 may be an indication of chronic kidney disease (CKD). Calculated eGFR is useful in patients with stable renal function. The eGFR calculation will not be reliable in acutely ill patients when serum creatinine is changing rapidly. It is not useful in  patients on dialysis. The eGFR calculation may not be applicable to patients at the low and high extremes of body sizes, pregnant women, and vegetarians.)  Routine UA:  24-Sep-13 13:35    Color (UA) Yellow   Clarity (UA) Cloudy   Glucose (UA) Negative   Bilirubin (UA) Negative   Ketones (UA) Trace   Specific Gravity (UA) 1.013   Blood (UA) Negative   pH (UA) 7.0   Protein (UA) Negative   Nitrite (UA) Negative   Leukocyte Esterase (UA) Negative (Result(s) reported on 09 Feb 2012 at 02:09PM.)   RBC (UA) 8 /HPF   WBC (UA) 6 /HPF   Bacteria (UA) TRACE   Epithelial Cells (UA) 2 /HPF   Renal Epithelial (UA) 9 /HPF   Mucous (UA) PRESENT (Result(s) reported on 09 Feb 2012 at 02:09PM.)  Routine Hem:  24-Sep-13 04:54    WBC (CBC) 8.7   RBC (CBC)  3.30   Hemoglobin (CBC)  10.4   Hematocrit (CBC)  30.0   Platelet Count (CBC)  560   MCV 91   MCH 31.6   MCHC 34.8   RDW 13.4   Neutrophil % 53.1   Lymphocyte % 35.5   Monocyte % 9.3   Eosinophil % 1.3   Basophil % 0.8   Neutrophil # 4.6   Lymphocyte # 3.1   Monocyte # 0.8   Eosinophil # 0.1   Basophil # 0.1 (Result(s) reported on 09 Feb 2012 at 06:10AM.)   Assessment/Plan:  Assessment/Plan:   Assessment Candidal esophagitis. On diflucan.    Plan Finish course of diflucan. Will sign off. Thanks.   Electronic Signatures: OVerdie Shire(MD)  (Signed 25-Sep-13 14:00)  Authored: Chief Complaint, VITAL SIGNS/ANCILLARY NOTES, Brief Assessment, Lab Results, Assessment/Plan   Last Updated: 25-Sep-13 14:00 by OVerdie Shire(MD)

## 2014-09-04 NOTE — Discharge Summary (Signed)
PATIENT NAME:  Angela HartiganDUFFER, Pang L MR#:  [id number] DATE OF BIRTH:  12-16-1940  DATE OF ADMISSION:  02/01/2012 DATE OF DISCHARGE:  02/10/2012   FINAL DIAGNOSES:  1. Left kidney focal pyelonephritis.  2. Systemic inflammatory response syndrome secondary to #1.  3. Diabetes mellitus, uncontrolled.  4. Diabetic peripheral neuropathy.  5. 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  6. Candida esophagitis.  7. Hypertension.  8. Paroxysmal atrial fibrillation.  9. Depression with anxiety.   HISTORY AND PHYSICAL: Please see the dictated admission history and physical.   SUMMARY OF HOSPITAL COURSE: The patient was admitted with evidence of systemic inflammatory response syndrome. Urinalysis revealed evidence of urinary tract infection, however, the amount seen in the urinalysis was fairly mild compared to the patient's onset of symptoms. She also has had chronic left side chest wall and abdominal pain, but had developed new and worsening pain in a similar area. She underwent imaging in the Emergency Room which revealed evidence of what looked like a ruptured cyst. Review of the CT scans from approximately 10 to 11 months ago showed no cyst in this kidney and it was thought that this most likely represented focal pyelonephritis. She was evaluated by urology and recommended antibiotic treatment. She was placed on IV antibiotics and this was converted over to oral medications, Keflex based on resistance patterns. Her initial leukocytosis resolved and she remained afebrile. Follow-up urinalysis showed improvement in the urinary tract infection. Her abdominal pain did improve, although it became very difficult to determine when she had further pain how much of this was related to her chronic left-sided chest wall pain and how much was related to her kidney. She was placed back on Lidoderm patch, which had helped her as an outpatient, and this seemed to make a big difference.   She  has had a long history of nausea, vomiting, and she does have some mild gastroparesis by recent gastric emptying study, as well as some dyspepsia and significant reflux. However, she was really not eating well. There was report of nausea and vomiting, although this really was not observed in the hospital of any significance. She also had some constipation. Gastroenterology saw the patient. Her bowel regimen was adjusted, with her balance beginning to work. A barium swallow was performed, which showed thickening of the esophagus. She underwent esophagogastroduodenoscopy which revealed diffuse severe candida esophagitis. She was placed on Diflucan and her symptoms improved significantly. She was noted to be eating and doing well with this, without further nausea or vomiting. She had episodes where she had increasing pain related to her neuropathy, and her Neurontin was titrated upward. She did well with this. Physical therapy worked with the patient and she showed some improvement, but it became clear that she would need additional help in the form of short-term rehabilitation and so a bed search was begun.   At this time, the patient will be discharged to a nursing facility in stable condition. Her physical activity will be up with a walker with assistance as tolerated. Home health physical therapy and occupational therapy should evaluate and treat the patient. She was not placed on aspirin or any other anticoagulation at discharge secondary to esophagitis, however, consideration should be made for adding aspirin given her paroxysmal atrial fibrillation, though she has been in normal sinus rhythm through the majority of this hospitalization. This was left to the discretion of the nursing home physician. She should have fingerstick blood sugars checked 3 times a day with meals, and  will be covered with regular sliding scale insulin. Follow-up was made with Dr. Bluford Kaufmann at [facility] to consider  repeat endoscopy to follow the progress with her Candida esophagitis. Her diet should be no added salt, no concentrated sweets. Would recommend the diet remain soft for one week due to her esophagitis.   DISCHARGE MEDICATIONS:  1. Benazepril 20 mg p.o. daily.  2. Vitamin D3, 1000 units p.o. daily.  3. Pantoprazole 40 mg p.o. daily.  4. Amitriptyline 50 mg p.o. at bedtime for insomnia and neuropathy.  5. Tramadol 50 mg p.o. every four hours p.r.n. severe pain.  6. Vitamin B12, 1000 mcg p.o. daily for history of B12 deficiency.  7. Regular sliding scale insulin.  8. Zofran 4 mg p.o. q.4h. p.r.n. nausea.  9. Gabapentin 400 mg p.o. t.i.d.  10. Colace 100 mg p.o. b.i.d. for constipation.  11. MiraLAX 17 grams p.o. daily p.r.n. constipation.  12. Cardizem CD 120 mg p.o. daily.  13. Seroquel 25 mg p.o. at bedtime for anxiety and to improve appetite; risks/benefits have been discussed with the patient and family.  14. Keflex 500 mg p.o. t.i.d. x5 days to complete course for urinary tract infection.  15. Lidoderm patch topically to the left chest wall, on 12 hours a day, then remove for 12 hours a day.  16. Imdur 30 mg p.o. daily for blood pressure control.  17. Lantus 22 units subcutaneous at bedtime.  18. Fluconazole 100 milligrams p.o. daily x14 days.   The patient had previously been on glipizide 5 mg daily. We elected to hold this and see if the Lantus will control symptoms. Previously, had been on digoxin 0.125 mg daily, but we have elected to hold this given her advanced age, although may need to be reintroduced if her heart rate increases.   CODE STATUS: The patient is DO NOT RESUSCITATE. An out of facility DO NOT RESUSCITATE order will be sent with her to the nursing facility.   TIME SPENT ON DISCHARGE: 50 minutes.    ____________________________ Lynnea Ferrier, MD bjk:ap D: 02/10/2012 13:52:59 ET T: 02/10/2012 14:13:45 ET JOB#: [id number]  cc: Lynnea Ferrier, MD,  <Dictator>  Daniel Nones MD ELECTRONICALLY SIGNED 02/12/2012 12:49

## 2014-09-04 NOTE — Consult Note (Signed)
Brief Consult Note: Diagnosis: Admitted for generalized weakness, fever and UTI.  CT scan of abdomen and pelvis related correlation with pyelonephritis.  Abnormal finding of thickening descending colon possible reactive change.  Complaint of chronic left side abdominal pain for the past couple of months. Nausea and vomiting which as improved.  Weight loss.   Discussed with Attending MD.   Comments: Patient's presentation will be discussed with Dr. Lutricia FeilPaul Oh and care plan to follow in addendum form.  Addendum:  Reviewed patient's presentation with Dr. Lutricia FeilPaul Oh.  Reviewed of prior imaging studies.  Gastric emptying study done 2008 revealed evidence of delayed gastric emptying.  19%.  Possible differential diagnoses for nausea, vomiting and associated left sided abdominal pain are infectious, inflammatory or related to known history of gastroparesis.  At this point recommend monitoring her symptoms.  She has been able to tolerate solid food today.  Continue with Protonix 40 mg po daily.  Consider low dose of Reglan given her known history of gastroparesis.  Will monitor over the weekend.  If she continues to tolerate solid food well and nausea and vomiting continue to improve will recommend endoscopy evaluation on an outpatient basis. If her condition does not continue to improve or worsens with N/V will consider proceeding with EGD on Monday.  Dr. Bluford Kaufmannh will continue to follow over the weekend.   EGD procedure report was located from 2006 with findings of normal esophagus, gastritis and normal examined duodenum..  Electronic Signatures: Rodman KeyHarrison, Dawn S (NP)  (Signed 20-Sep-13 13:06)  Authored: Brief Consult Note   Last Updated: 20-Sep-13 13:06 by Rodman KeyHarrison, Dawn S (NP)

## 2014-09-04 NOTE — H&P (Signed)
PATIENT NAME:  Angela Ryan, Angela Ryan MR#:  [id number] DATE OF BIRTH:  June 05, 1940  DATE OF ADMISSION:  02/01/2012  PRIMARY CARE PHYSICIAN: Daniel Nones, MD  History is obtained from the patient, her husband at bedside, old records reviewed, imaging studies and EKG personally reviewed, and the case discussed with Dr. Cyril Loosen.  CHIEF COMPLAINT: Pain all over and weakness.   HISTORY OF PRESENTING ILLNESS: The patient is a 74 year old Caucasian female patient with history of diabetes, recurrent UTIs, peripheral neuropathy, and paroxysmal atrial fibrillation who presents to the emergency room brought in by her husband for generalized pain. The patient also had nausea and one episode of vomiting. The patient does not contribute much to the history. She mentions pain all over in no specific site where it is maximum. No other concerns but the husband at the bedside mentions that she has had nausea and vomited once. Has had decreased appetite and was unable to sleep secondary to pain yesterday night. The patient has been tachycardic up to 116. Urinalysis shows a urinary tract infection. Temperature is 99.5 and the patient is actively having chills at this time and is being admitted to the hospitalist service with urinary tract infection, sepsis, and generalized weakness with inability to walk.   The patient has had chronic pain in the right lower chest laterally for which she has a Lidoderm patch. The husband mentions that the patient has had extensive work-up over the past few months with no cause found for it. In reviewing old records, the patient had a CT scan of the chest on 12/28/2011 which showed no acute abnormalities.   PAST MEDICAL HISTORY:  1. Diabetes. 2. Depression. 3. Atrial fibrillation. 4. Hypertension. 5. Chronic bilateral foot pain.  6. Chronic left lower chest pain.   ALLERGIES: Sulfa medications.   SOCIAL HISTORY: The patient lives with her husband. Ambulates with a cane at home independently.  No alcohol. No tobacco. No illicit drugs.   CODE STATUS: DNR/DNI.   FAMILY HISTORY: Father died at age 68 years and had a stroke. Mother died when she was 25 and had diabetes.   HOME MEDICATIONS:  1. Amitriptyline 15 mg oral once a day.  2. Benazepril 20 mg oral once a day.  3. Digoxin 250 mcg 1/2 tablet oral once a day.  4. Gabapentin 300 mg oral four times daily. 5. Glipizide 5 mg oral once a day.  6. Ibuprofen 200 mg oral every four hours as needed for pain.  7. Lantus 10 units subcutaneous at bedtime.  8. NovoLog 10 units subcutaneous two times a day after meals.  9. Protonix 40 mg oral once a day.  10. Promethazine 12.5 mg orally every six hours as needed for nausea.  11. Tramadol 50 mg oral every four hours as needed for pain.  12. Vicodin 5/500 mg one tablet orally every four hours. 13. Vitamin B12 1000 mcg orally once a day.  14. Vitamin D3 1000 international units orally once a day.   REVIEW OF SYSTEMS: The patient seems to be confused and drowsy at this time and is unable to give any review of systems except saying that she has pain all over.   PHYSICAL EXAMINATION:   VITAL SIGNS: Temperature 99.5, pulse 116 and regular, breathing 24 per minute, saturating 97% on room air, and blood pressure 189/83.   GENERAL: Thin, frail, elderly Caucasian female patient lying in bed secondary to pain all over with active chills and rigors.   PSYCHIATRIC: Drowsy, oriented to person but not  to place or time.   HEENT: Atraumatic, normocephalic. Oral mucosa dry and pink. External ears and nose normal. No pallor. No icterus. Pupils bilaterally equal and reactive to light.   NECK: Supple. No thyromegaly. No palpable lymph nodes. Trachea midline. No carotid bruit or JVD.   CARDIOVASCULAR: S1 and S2 tachycardic, regular, without any murmurs. Peripheral pulses 2+.  No edema.   RESPIRATORY: Normal work of breathing. Clear to auscultation on both sides.   ABDOMEN: Tenderness all over the  abdomen diffusely. No rigidity or guarding. Bowel sounds present. No hepatosplenomegaly palpable. She has a Lidoderm patch stuck at left upper quadrant. No scars found.   GENITOURINARY: Has CVA tenderness bilaterally, although the patient seems to have pain all over her back. Suprapubic tenderness positive.   SKIN: Warm and dry. No petechiae, rash, or ulcers.   MUSCULOSKELETAL: Has pain in bilateral ankles which seems to be chronic. No redness, effusion, or swelling noticed. Normal muscle tone.   NEUROLOGICAL: Motor strength 4 out of 5 in bilateral lower extremities, 5 out of 5 in upper extremities. Sensation intact all over.   LYMPHATIC: No cervical lymphadenopathy.   LABORATORY, DIAGNOSTIC AND RADIOLOGIC DATA: Glucose 174, BUN 24, creatinine 1.05, sodium 136, and potassium 4. GFR 55. AST, ALT, and alkaline phosphatase normal. WBC 21.9 with hemoglobin 12.8 and platelets 306 with neutrophils 87%.   Urinalysis shows 20 WBCs and 3+ bacteria with WBC clumps.  Old cultures show Klebsiella in urine, from May 2013, which was sensitive to ciprofloxacin and ceftriaxone.   Telemetry shows sinus tachycardia.  Chest x-ray shows no acute cardiopulmonary disease.   ASSESSMENT AND PLAN:  1. Urinary tract infection with sepsis. The patient does have sinus tachycardia and white count of 21.2 and temperature is 99.5. We will admit for IV antibiotics and IV fluid hydration. We will check a lactic acid level. We will await cultures from urine and blood cultures. The patient has had recurrent UTIs in the past and the last urine culture grew Klebsiella.  2. Acute encephalopathy secondary to urinary tract infection. We will monitor and get a CT scan of the head.  3. Generalized weakness and pain likely secondary from the infection. The patient does seem to have chronic pain in her abdomen and bilateral feet.  4. Dehydration. Start on IV fluids.  5. Insulin-dependent diabetes mellitus. We will hold oral  hypoglycemics, continue the Lantus and put the patient on sliding scale insulin and diabetic diet.  6. Deep vein thrombosis prophylaxis with Lovenox.  7. Hypertension, uncontrolled. Continue home medications. We will use IV medications at this time. Blood pressure might be elevated secondary to the pain.   CODE STATUS: DNR/DNI as discussed with her husband at bedside.   TIME SPENT: Time spent today on this case was 65 minutes with more than 50% time spent in coordination of care.  ____________________________ Molinda BailiffSrikar R. Neeya Prigmore, MD srs:slb D: 02/01/2012 16:57:39 ET T: 02/01/2012 17:20:32 ET JOB#: [id number]  cc: Wardell HeathSrikar R. Elpidio AnisSudini, MD, <Dictator> Curtis SitesBert J. Klein III, MD Orie FishermanSRIKAR R Sofya Moustafa MD ELECTRONICALLY SIGNED 02/03/2012 10:40

## 2014-09-04 NOTE — Consult Note (Signed)
PATIENT NAME:  Angela Ryan, Angela Ryan MR#:  [id number] DATE OF BIRTH:  Jun 06, 1940  DATE OF CONSULTATION:  02/02/2012  REFERRING PHYSICIAN:  Dr. Graciela HusbandsKlein  CONSULTING PHYSICIAN:  Suszanne ConnersMichael R. Evelene CroonWolff, MD  REASON FOR CONSULTATION: Abnormal CT interpretation of the left kidney.   HISTORY OF PRESENT ILLNESS: Ms. Angela Ryan is a 74 year old Caucasian female who presented to the Emergency Room with generalized abdominal pain. Pain is worse on the right side. She was also noted to have insomnia and some tachycardia. She had a low-grade fever and urinalysis was consistent with urinary tract infection. She specifically denies left flank pain. She also denied hematuria.   ALLERGIES: The patient is allergic to sulfa drugs.  CHRONIC HOME MEDICATIONS:  1. Amitriptyline. 2. Benazepril. 3. Digoxin. 4. Gabapentin. 5. Glipizide. 6. Ibuprofen. 7. Lantus insulin. 8. NovoLog insulin. 9. Protonix. 10. Promethazine. 11. Tramadol. 12. Vicodin. 13. Vitamin B12. 14. Vitamin D3.  PAST SURGICAL HISTORY: Negative for urological surgery.   SOCIAL HISTORY: She denied tobacco or alcohol use.   FAMILY HISTORY: Negative for kidney disease.   PAST AND CURRENT MEDICAL CONDITIONS:  1. Diabetes. 2. Depression.  3. Atrial fibrillation.  4. Hypertension.  5. Chronic chest pain.  6. Chronic foot pain.   PHYSICAL EXAMINATION: Deferred.   PERTINENT LABORATORY STUDIES: BUN 16. Creatinine 0.50.   CT scan report and films were reviewed with Dr. Salome HolmesHector Cooper.   Renal ultrasound report was reviewed.   IMPRESSION: Atypical left renal cyst.   SUGGESTIONS:  1. Would repeat renal ultrasound in three months.  2. Very doubtful that her right-sided abdominal pain is being caused by this atypical cyst on the left side. 3. Urological intervention is not indicated at this point and would follow-up with ultrasound per primary care physician.   ____________________________ Suszanne ConnersMichael R. Evelene CroonWolff, MD mrw:drc D: 02/02/2012 12:12:00  ET T: 02/02/2012 13:34:34 ET JOB#: [id number]  cc: Suszanne ConnersMichael R. Evelene CroonWolff, MD, <Dictator> Lynnea FerrierBert J. Klein III, MD Orson ApeMICHAEL R Glennie Rodda MD ELECTRONICALLY SIGNED 02/03/2012 9:11

## 2014-09-04 NOTE — Consult Note (Signed)
Pt seen and examined. See Dawn Harrison's notes. Pt has multiple potential causes of nausea, incl infection, meds, diabetic gastroparesis, esophagitis, etc. Eliminate potential offending agents, increase protonix, continue Abx, etc. If no signif improvement over the weekend, then EGD on Monday. Pt also needs colonoscopy eventually. Not sure she can drink prep adequately to do the colon now. Pt improves significantly over the weekend, then EGD/colon can be arranged outpt later. Will follow. Thanks.  Electronic Signatures: Lutricia Feilh, Adna Nofziger (MD)  (Signed on 20-Sep-13 13:17)  Authored  Last Updated: 20-Sep-13 13:17 by Lutricia Feilh, Abdulla Pooley (MD)

## 2014-09-04 NOTE — Consult Note (Signed)
Brief Consult Note: Diagnosis: Atypical L renal cyst.   Patient was seen by consultant.   Consult note dictated.   Discussed with Attending MD.   Comments: Repeat ultrasound in 3 months. Urological intervention not indicated at this point in time.  Electronic Signatures: Orson ApeWolff, Michael R (MD)  (Signed 17-Sep-13 12:07)  Authored: Brief Consult Note   Last Updated: 17-Sep-13 12:07 by Orson ApeWolff, Michael R (MD)

## 2014-09-04 NOTE — Consult Note (Signed)
Chief Complaint:   Subjective/Chief Complaint Feels better. Eating breakfast and tolerating so far without nausea.   VITAL SIGNS/ANCILLARY NOTES: **Vital Signs.:   21-Sep-13 07:45   Vital Signs Type POCT   Nurse Fingerstick (mg/dL) FSBS (fasting range 65-99 mg/dL) 200   Brief Assessment:   Cardiac Regular    Respiratory clear BS    Gastrointestinal mild epig/LUQ tenderness   Lab Results: Hepatic:  21-Sep-13 05:58    Bilirubin, Total 0.4   Alkaline Phosphatase 83   SGPT (ALT)  11   SGOT (AST)  14   Total Protein, Serum 6.7   Albumin, Serum  2.3  Routine Chem:  21-Sep-13 05:58    Glucose, Serum  212   BUN 11   Creatinine (comp)  0.51   Sodium, Serum 136   Potassium, Serum  3.4   Chloride, Serum  96   CO2, Serum  34   Calcium (Total), Serum 9.1   Osmolality (calc) 278   eGFR (African American) >60   eGFR (Non-African American) >60 (eGFR values <38m/min/1.73 m2 may be an indication of chronic kidney disease (CKD). Calculated eGFR is useful in patients with stable renal function. The eGFR calculation will not be reliable in acutely ill patients when serum creatinine is changing rapidly. It is not useful in  patients on dialysis. The eGFR calculation may not be applicable to patients at the low and high extremes of body sizes, pregnant women, and vegetarians.)   Anion Gap  6  Routine Hem:  21-Sep-13 05:58    WBC (CBC) 5.8   RBC (CBC)  3.55   Hemoglobin (CBC)  11.1   Hematocrit (CBC)  31.9   Platelet Count (CBC) 379   MCV 90   MCH 31.2   MCHC 34.7   RDW 13.1   Neutrophil % 54.3   Lymphocyte % 30.3   Monocyte % 13.7   Eosinophil % 1.1   Basophil % 0.6   Neutrophil # 3.2   Lymphocyte # 1.8   Monocyte # 0.8   Eosinophil # 0.1   Basophil # 0.0 (Result(s) reported on 06 Feb 2012 at 06:54AM.)   Assessment/Plan:  Assessment/Plan:   Assessment Nausea better.    Plan Will see how she does over the weekend. If stable, ok for discharge to rehab on Monday and  arrange outpt endoscopies later. If worse by tomorrow, then at least EGD on Monday before discharge. Thanks.   Electronic Signatures: OVerdie Shire(MD)  (Signed 21-Sep-13 09:36)  Authored: Chief Complaint, VITAL SIGNS/ANCILLARY NOTES, Brief Assessment, Lab Results, Assessment/Plan   Last Updated: 21-Sep-13 09:36 by OVerdie Shire(MD)

## 2014-09-04 NOTE — Consult Note (Signed)
Chief Complaint:   Subjective/Chief Complaint LUQ pain last night. More nausea this AM. For CT of chest today.   VITAL SIGNS/ANCILLARY NOTES: **Vital Signs.:   22-Sep-13 04:30   Vital Signs Type Routine   Temperature Temperature (F) 98.4   Celsius 36.8   Temperature Source oral   Pulse Pulse 77   Respirations Respirations 18   Systolic BP Systolic BP 992   Diastolic BP (mmHg) Diastolic BP (mmHg) 73   Mean BP 90   Pulse Ox % Pulse Ox % 94   Pulse Ox Activity Level  At rest   Oxygen Delivery Room Air/ 21 %   Brief Assessment:   Cardiac Regular    Respiratory clear BS    Gastrointestinal mild LUQ tenderness   Lab Results: Routine Chem:  22-Sep-13 05:31    Glucose, Serum  212   BUN 12   Creatinine (comp)  0.51   Sodium, Serum 139   Potassium, Serum 4.2   Chloride, Serum 98   CO2, Serum  33   Calcium (Total), Serum 9.3   Anion Gap 8   Osmolality (calc) 284   eGFR (African American) >60   eGFR (Non-African American) >60 (eGFR values <38m/min/1.73 m2 may be an indication of chronic kidney disease (CKD). Calculated eGFR is useful in patients with stable renal function. The eGFR calculation will not be reliable in acutely ill patients when serum creatinine is changing rapidly. It is not useful in  patients on dialysis. The eGFR calculation may not be applicable to patients at the low and high extremes of body sizes, pregnant women, and vegetarians.)  Routine Hem:  22-Sep-13 05:31    WBC (CBC) 6.6   RBC (CBC)  3.45   Hemoglobin (CBC)  10.7   Hematocrit (CBC)  31.2   Platelet Count (CBC)  445   MCV 90   MCH 30.9   MCHC 34.2   RDW 13.4   Neutrophil % 58.0   Lymphocyte % 29.1   Monocyte % 10.3   Eosinophil % 1.9   Basophil % 0.7   Neutrophil # 3.8   Lymphocyte # 1.9   Monocyte # 0.7   Eosinophil # 0.1   Basophil # 0.0 (Result(s) reported on 07 Feb 2012 at 06:09AM.)   Assessment/Plan:  Assessment/Plan:   Assessment Nausea. May not be related to her chest  pain.    Plan Nevertheless, plan EGD tomorrow. Await CT of chest. Thanks.   Electronic Signatures: OVerdie Shire(MD)  (Signed 22-Sep-13 10:32)  Authored: Chief Complaint, VITAL SIGNS/ANCILLARY NOTES, Brief Assessment, Lab Results, Assessment/Plan   Last Updated: 22-Sep-13 10:32 by OVerdie Shire(MD)

## 2014-09-09 NOTE — Op Note (Signed)
PATIENT NAME:  Angela Ryan, Angela L MR#:  [id number] DATE OF BIRTH:  1940/07/26  DATE OF PROCEDURE:  07/10/2011  PREOPERATIVE DIAGNOSES:  1. Left medial ankle abscess.  2. Left lateral foot erythema with abnormal changes to calcaneus on magnetic resonance imaging.  POSTOPERATIVE DIAGNOSES:  1. Left medial ankle abscess.  2. Abscessed dorsolateral left foot.  3. Erosion left calcaneus.   PROCEDURE PERFORMED:   1. Left medial ankle incision and drainage.  2. Left lateral foot incision and drainage.  3. Bone biopsy, left calcaneus.   COMPLICATIONS: None.   SPECIMEN: Bone for pathology. Bone for final culture. Swabs of medial and lateral ankle and foot incisions for culture.   ESTIMATED BLOOD LOSS: Less than 50 mL.   OPERATIVE INDICATIONS:  This is a 74 year old female who has had a draining area on the medial aspect of her left ankle. We tried multiple attempts at antibiotics. We underwent an incision and drainage of her left medial ankle when she has had recurrence of continued drainage to the area. At this time, she is brought to the Operating Room after having obtained an MRI that showed some erosive changes to the lateral aspect of the left foot as well as a collection of fluid on the medial ankle. All risks, benefits, alternatives, and complications associated with the procedure were discussed with the patient, and informed consent was given.   OPERATIVE PROCEDURE: The patient was brought into the Operating Room and placed on the operating table in the supine position. IV sedation was induced by the Anesthesia team. A local block was placed around the medial and lateral left ankle and foot areas. Attention was directed initially to the lateral aspect of the left foot  where there was noted to be an erythematous area that was somewhat edematous. An incision was made overlying about the calcaneocuboid region. After entering the superficial epidermis and into the dermal layer, purulent drainage  was noted from this area. A swab of the fluid was then taken at this time. Further dissection was carried down to the calcaneus that was noted to have erosive changes on the MRI. There was noted to be an erosion on the medial calcaneus just proximal to the calcaneocuboid joint. At this time, a small plug of bone was removed from the calcaneus laterally just proximal to the level of the erosive change. A small portion of this was sent for culture in a sterile cup. The remainder of the bone was sent for pathological examination. This wound was then flushed with copious amounts of irrigation. No further infectious processes were noted after the initial purulent drainage was noted. The wound was then flushed with copious amounts of irrigation and irrigated with the VersaJet as well. Layered closure was performed with a 4-0 Vicryl for the subcutaneous tissue and a 4-0 nylon for the skin. The most proximal aspect of the wound was packed with plain Nu Gauze packing. Attention was then directed to the medial aspect of the ankle where coursing from proximal to the Achilles tendon insertion an open draining abscess was noted. This incision was taken down to the medial aspect of the foot coursing along the tarsal tunnel region. Sharp and blunt dissection was carried down to the subcutaneous tissue. There was only noted to be a small amount of purulence in the area. A swab of this wound was also taken. This was then debrided with a VersaJet. All bleeders were Bovie cauterized and it was then pulse-irrigated with a bulb syringe. The incision was  then closed to skin with a 4-0 nylon. It was packed with a Surgicel to collect any drainage and also packed at the proximal and distal wounds with Nu Gauze plain packing strips. A well compressive sterile bulky dressing was then placed on the medial and lateral left ankle. She was then placed in a well compressive sterile dressing after a Marcaine block was placed to all areas. The  patient was taken from the Operating Room to the PACU with all vital signs stable and neurovascular status intact. She will be sent home on p.o. doxycycline and Cipro. She was also given a prescription for Percocet for pain. We will await the bone culture and biopsy as the patient may need IV antibiotics, and we will likely consider Infectious Disease consultation in the near future.   ____________________________ Argentina Donovan Ether Griffins, DPM jaf:cbb D: 07/10/2011 14:48:48 ET T: 07/10/2011 16:53:39 ET JOB#: [id number]  cc: Jill Alexanders A. Ether Griffins, DPM, <Dictator> Twyla Dais DPM ELECTRONICALLY SIGNED 07/16/2011 8:40

## 2014-09-09 NOTE — Op Note (Signed)
PATIENT NAME:  Angela Ryan, Angela Ryan MR#:  [id number] DATE OF BIRTH:  05/16/1941  DATE OF PROCEDURE:  05/22/2011  PREOPERATIVE DIAGNOSIS: Left medial ankle chronic abscess.   POSTOPERATIVE DIAGNOSIS: Left medial ankle chronic abscess.  PROCEDURE: I and D left medial ankle abscess with soft tissue sent for pathology.   SURGEON: Rodrick Payson A. Ether GriffinsFowler, DPM  ANESTHESIA: MAC with local.   HEMOSTASIS: Lidocaine with epinephrine 1:200,000.   COMPLICATIONS: None.   SPECIMEN: Soft tissue for pathological evaluation.   OPERATIVE PROCEDURE: The patient was brought into the OR and placed on the operating table in the supine position. IV sedation was administered by the anesthesia team. A local block was placed around the incision site. After sterile prep and drape, a medial incision was made basically along the tarsal tunnel region of the ankle. Blunt dissection was taken down. Dissection was taken posterior to the ankle joint just deep to the Achilles tendon region. There was noticed some darkened discolored subcutaneous tissue. I sent this for pathological examination. A wound culture was also taken at this time. The wound was then lavaged with bulb irrigation. One liter of saline was infiltrated within the wound itself. The distal portion of the previous ulcerative site was also probed and opened about a centimeter but it did not seem to track into the deeper structures of the foot. At this time, the proximal and distal ends of the incision were reapproximated with a central 1-1/2 to 2 cm area of the incision left open. This was packed and a bulky sterile dressing was then placed on the left medial ankle. She tolerated the procedure and anesthesia well and was transported from the OR to the PAC-U with all vital signs stable and neurovascular status intact.     I will see her in the outpatient clinic in 5 to 7 days. The family will perform a dressing change on Sunday.  ____________________________ Argentina DonovanJustin A.  Ether GriffinsFowler, DPM jaf:drc D: 05/22/2011 13:02:53 ET T: 05/22/2011 13:18:33 ET JOB#: [id number]  cc: Jill AlexandersJustin A. Ether GriffinsFowler, DPM, <Dictator> Dalena Plantz DPM ELECTRONICALLY SIGNED 06/18/2011 15:11

## 2015-08-17 DEATH — deceased

## 2019-12-05 NOTE — Progress Notes (Signed)
This encounter was created in error - please disregard.
